# Patient Record
Sex: Female | Born: 1995 | Race: Black or African American | Hispanic: No | Smoking: Never smoker
Health system: Southern US, Community
[De-identification: ages and names within clinical notes are randomized; demographics above are authoritative.]

## PROBLEM LIST (undated history)

## (undated) DIAGNOSIS — T7840XA Allergy, unspecified, initial encounter: Secondary | ICD-10-CM

## (undated) DIAGNOSIS — E669 Obesity, unspecified: Secondary | ICD-10-CM

## (undated) DIAGNOSIS — K219 Gastro-esophageal reflux disease without esophagitis: Secondary | ICD-10-CM

## (undated) DIAGNOSIS — R519 Headache, unspecified: Secondary | ICD-10-CM

## (undated) DIAGNOSIS — F419 Anxiety disorder, unspecified: Secondary | ICD-10-CM

## (undated) DIAGNOSIS — N390 Urinary tract infection, site not specified: Secondary | ICD-10-CM

## (undated) DIAGNOSIS — N809 Endometriosis, unspecified: Secondary | ICD-10-CM

## (undated) DIAGNOSIS — R51 Headache: Secondary | ICD-10-CM

## (undated) HISTORY — PX: NO PAST SURGERIES: SHX2092

## (undated) HISTORY — DX: Obesity, unspecified: E66.9

## (undated) HISTORY — DX: Allergy, unspecified, initial encounter: T78.40XA

---
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
## 2015-02-12 IMAGING — DX DG CHEST 2V
2 series · 2 of 2 positions shown · non-contrast
Comparison: None

CLINICAL DATA: Cough and chest pain for 1 day, asthma

EXAM:
CHEST  2 VIEW

[chest pa]
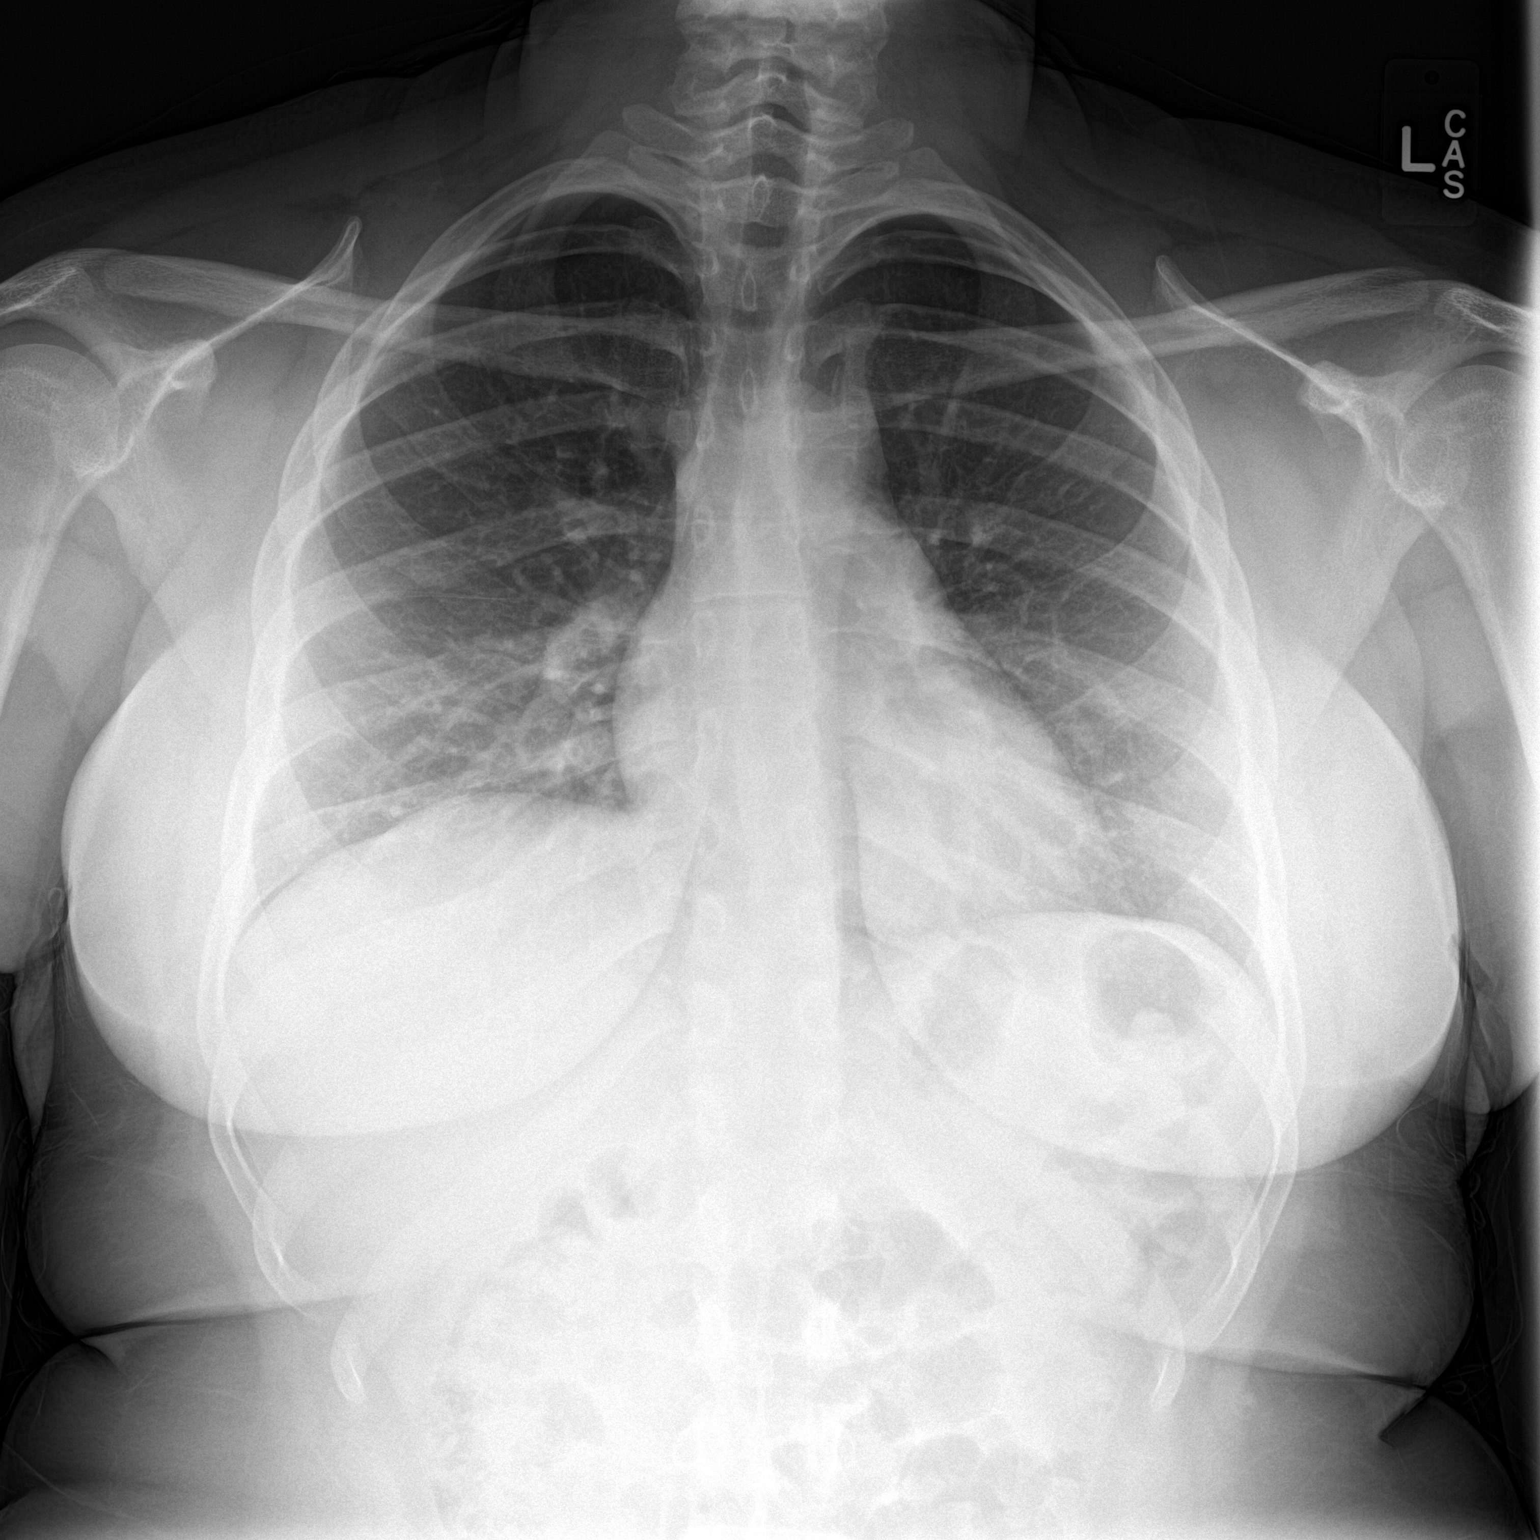

[chest lat]
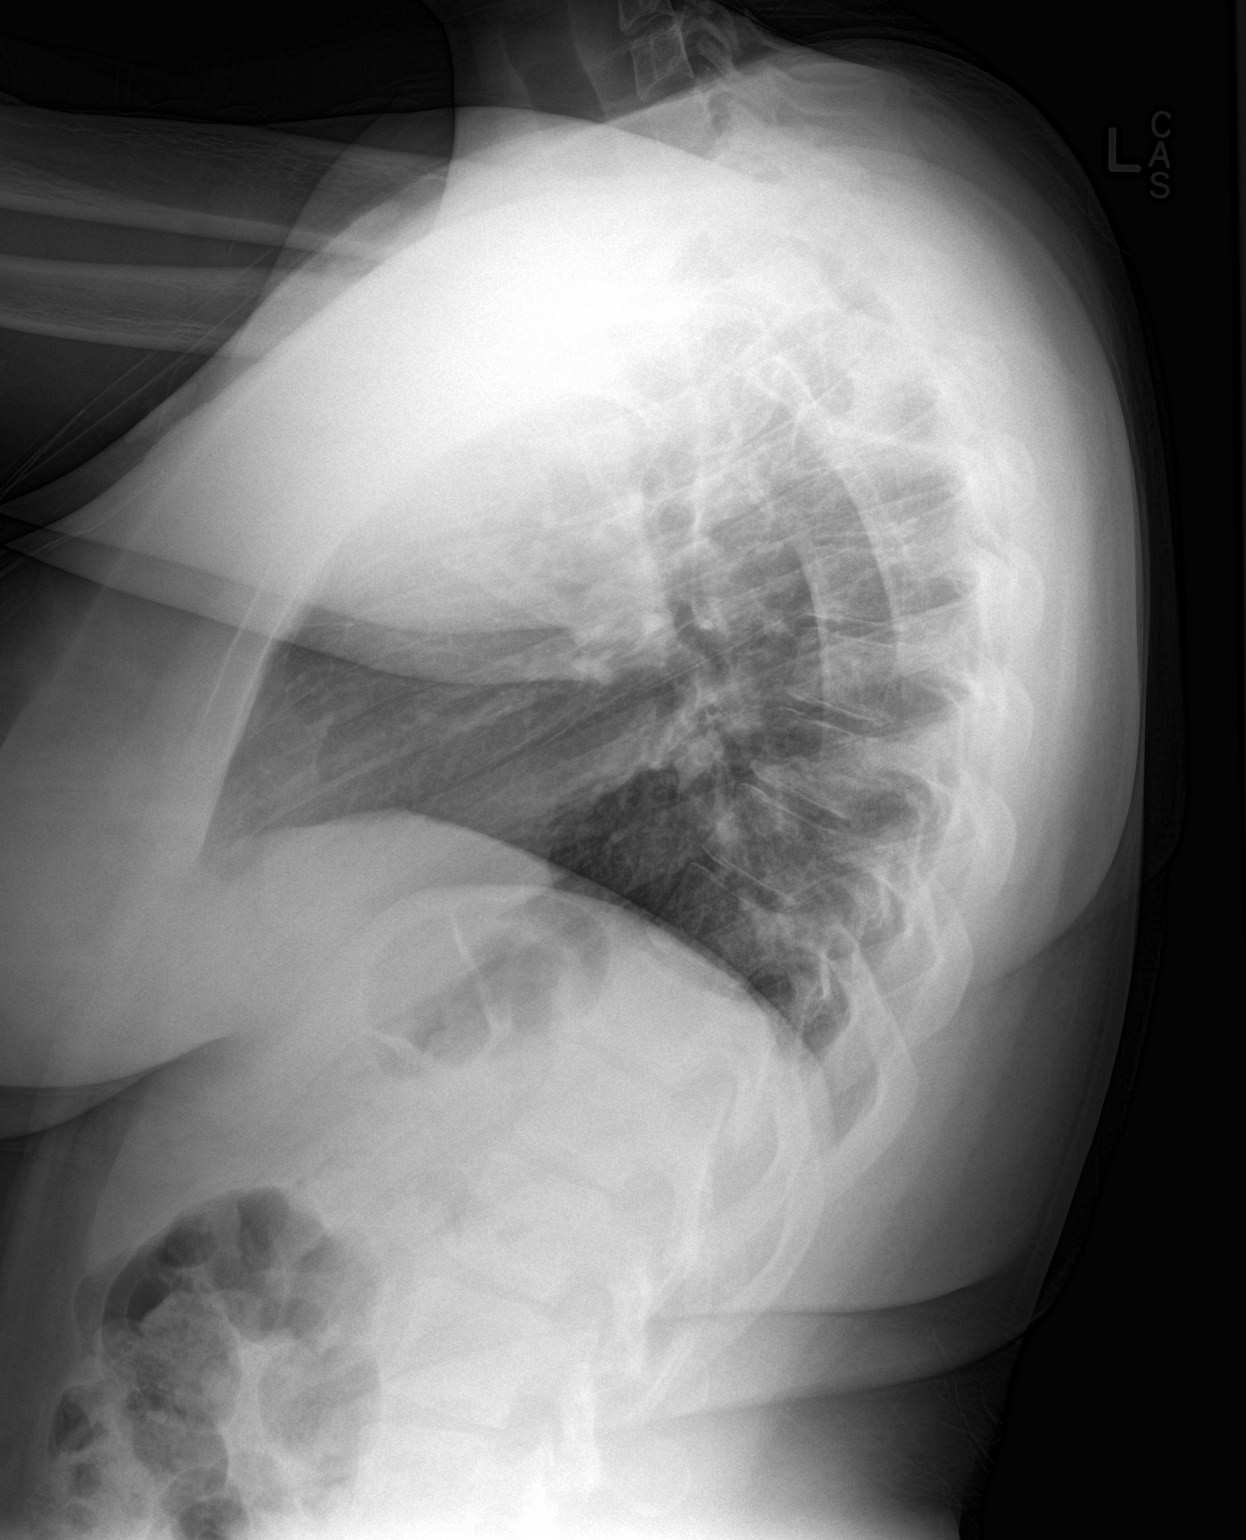

[2 of 2 positions shown; findings below may reference images not displayed]

FINDINGS: The heart size and mediastinal contours are within normal limits.
Both lungs are clear. The visualized skeletal structures are
unremarkable.
IMPRESSION: No active cardiopulmonary disease.

## 2015-09-03 ENCOUNTER — Emergency Department
Admission: EM | Admit: 2015-09-03 | Discharge: 2015-09-03 | Disposition: A | Discharge status: Home / Self Care | Payer: Medicaid Other | Attending: Emergency Medicine | Admitting: Emergency Medicine

## 2015-09-03 ENCOUNTER — Encounter: Payer: Self-pay | Admitting: Emergency Medicine

## 2015-09-03 ENCOUNTER — Emergency Department: Payer: Medicaid Other

## 2015-09-03 DIAGNOSIS — Z91040 Latex allergy status: Secondary | ICD-10-CM | POA: Diagnosis not present

## 2015-09-03 DIAGNOSIS — Z79899 Other long term (current) drug therapy: Secondary | ICD-10-CM | POA: Insufficient documentation

## 2015-09-03 DIAGNOSIS — R1030 Lower abdominal pain, unspecified: Secondary | ICD-10-CM | POA: Diagnosis not present

## 2015-09-03 DIAGNOSIS — J45909 Unspecified asthma, uncomplicated: Secondary | ICD-10-CM | POA: Diagnosis not present

## 2015-09-03 DIAGNOSIS — Z91018 Allergy to other foods: Secondary | ICD-10-CM | POA: Insufficient documentation

## 2015-09-03 DIAGNOSIS — Z791 Long term (current) use of non-steroidal anti-inflammatories (NSAID): Secondary | ICD-10-CM | POA: Insufficient documentation

## 2015-09-03 LAB — CBC WITH DIFFERENTIAL/PLATELET
BASOS PCT: 1 %
Basophils Absolute: 0 10*3/uL (ref 0–0.1)
EOS ABS: 0.1 10*3/uL (ref 0–0.7)
EOS PCT: 2 %
HCT: 35.3 % (ref 35.0–47.0)
Hemoglobin: 11.7 g/dL — ABNORMAL LOW (ref 12.0–16.0)
LYMPHS ABS: 1.3 10*3/uL (ref 1.0–3.6)
Lymphocytes Relative: 33 %
MCH: 29 pg (ref 26.0–34.0)
MCHC: 33.3 g/dL (ref 32.0–36.0)
MCV: 87.2 fL (ref 80.0–100.0)
MONOS PCT: 11 %
Monocytes Absolute: 0.4 10*3/uL (ref 0.2–0.9)
NEUTROS PCT: 53 %
Neutro Abs: 2.1 10*3/uL (ref 1.4–6.5)
Platelets: 437 10*3/uL (ref 150–440)
RBC: 4.04 MIL/uL (ref 3.80–5.20)
RDW: 13 % (ref 11.5–14.5)
WBC: 3.9 10*3/uL (ref 3.6–11.0)

## 2015-09-03 LAB — URINALYSIS COMPLETE WITH MICROSCOPIC (ARMC ONLY)
BILIRUBIN URINE: NEGATIVE
Bacteria, UA: NONE SEEN
Glucose, UA: NEGATIVE mg/dL
HGB URINE DIPSTICK: NEGATIVE
Ketones, ur: NEGATIVE mg/dL
Nitrite: NEGATIVE
PH: 7 (ref 5.0–8.0)
PROTEIN: NEGATIVE mg/dL
Specific Gravity, Urine: 1.019 (ref 1.005–1.030)

## 2015-09-03 LAB — COMPREHENSIVE METABOLIC PANEL
ALK PHOS: 67 U/L (ref 38–126)
ALT: 13 U/L — ABNORMAL LOW (ref 14–54)
AST: 14 U/L — ABNORMAL LOW (ref 15–41)
Albumin: 4 g/dL (ref 3.5–5.0)
Anion gap: 6 (ref 5–15)
BUN: 9 mg/dL (ref 6–20)
CALCIUM: 8.7 mg/dL — AB (ref 8.9–10.3)
CHLORIDE: 105 mmol/L (ref 101–111)
CO2: 26 mmol/L (ref 22–32)
Creatinine, Ser: 0.73 mg/dL (ref 0.44–1.00)
GFR calc non Af Amer: >60 mL/min (ref >60)
Glucose, Bld: 94 mg/dL (ref 65–99)
POTASSIUM: 3.9 mmol/L (ref 3.5–5.1)
SODIUM: 137 mmol/L (ref 135–145)
Total Bilirubin: 0.3 mg/dL (ref 0.3–1.2)
Total Protein: 6.9 g/dL (ref 6.5–8.1)

## 2015-09-03 LAB — TSH: TSH: 1.46 u[IU]/mL (ref 0.350–4.500)

## 2015-09-03 LAB — POCT PREGNANCY, URINE: Preg Test, Ur: NEGATIVE

## 2015-09-03 LAB — LIPASE, BLOOD: Lipase: 38 U/L (ref 11–51)

## 2015-09-03 NOTE — ED Provider Notes (Signed)
Time Seen: Approximately [id number]  I have reviewed the triage notes  Chief Complaint: Possible Pregnancy   History of Present Illness: Dawn Walton is a 20 y.o. female *who states that she's had a several week history of intermittent bilateral lower abdominal pain with occasional pain toward the left flank area. The pain seems to be exacerbated by meals and sometimes when she lies on her left side. Patient states she's had a history recently of irregular menstrual periods which started back in February. She denies any current vaginal discharge or bleeding. She states she does have some concern that she may be pregnant. She did not do a home pregnancy test yet. She denies any chest or pleuritic component to the pain. She denies any dysuria, hematuria, urinary frequency. She denies any loose stool or constipation.   Past Medical History  Diagnosis Date  . Allergy   . Asthma     unclear per pt  . Obesity     Patient Active Problem List   Diagnosis Date Noted  . URI (upper respiratory infection) 05/02/2014  . Candida vaginitis 05/02/2014  . Suprapubic pain 03/21/2014  . Leukopenia 07/03/2013  . Obesity 05/04/2013  . Contraception 05/04/2013  . Menorrhagia 04/14/2013    History reviewed. No pertinent past surgical history.  History reviewed. No pertinent past surgical history.  Current Outpatient Rx  Name  Route  Sig  Dispense  Refill  . albuterol (PROVENTIL HFA;VENTOLIN HFA) 108 (90 BASE) MCG/ACT inhaler   Inhalation   Inhale into the lungs every 6 (six) hours as needed for wheezing or shortness of breath.         Marland Kitchen ibuprofen (ADVIL,MOTRIN) 800 MG tablet   Oral   Take 1 tablet (800 mg total) by mouth 3 (three) times daily.   21 tablet   0   . nitrofurantoin, macrocrystal-monohydrate, (MACROBID) 100 MG capsule   Oral   Take 1 capsule (100 mg total) by mouth 2 (two) times daily.   10 capsule   0   . ondansetron (ZOFRAN) 4 MG tablet   Oral   Take 1 tablet (4 mg  total) by mouth every 6 (six) hours.   12 tablet   0     Allergies:  Fruit & vegetable daily; Apple; Carrot; Latex; and Tomato  Family History: Family History  Problem Relation Age of Onset  . Hyperlipidemia Maternal Grandmother   . Hypertension Maternal Grandmother   . Diabetes Maternal Grandfather   . Heart disease Maternal Grandfather   . Stroke Maternal Grandfather     Social History: Social History  Substance Use Topics  . Smoking status: Never Smoker   . Smokeless tobacco: Never Used  . Alcohol Use: No     Review of Systems:   10 point review of systems was performed and was otherwise negative:  Constitutional: No fever Eyes: No visual disturbances ENT: No sore throat, ear pain Cardiac: No chest pain Respiratory: No shortness of breath, wheezing, or stridor Abdomen: Bilateral lower abdominal pain with occasional nausea and states food "" taste funny "". No loose stool or diarrhea Endocrine: No weight loss, No night sweats Extremities: Mild increase in peripheral edema bilaterally, no calf pain. Skin: No rashes, easy bruising Neurologic: No focal weakness, trouble with speech or swollowing Urologic: No dysuria, Hematuria, or urinary frequency   Physical Exam:  ED Triage Vitals  Enc Vitals Group     BP 09/03/15 1334 114/70 mmHg     Pulse Rate 09/03/15 1334 99  Resp 09/03/15 1334 18     Temp 09/03/15 1334 98.1 F (36.7 C)     Temp Source 09/03/15 1334 Oral     SpO2 09/03/15 1334 98 %     Weight --      Height --      Head Cir --      Peak Flow --      Pain Score 09/03/15 1338 4     Pain Loc --      Pain Edu? --      Excl. in [place]? --     General: Awake , Alert , and Oriented times 3; GCS 15 Head: Normal cephalic , atraumatic Eyes: Pupils equal , round, reactive to light Nose/Throat: No nasal drainage, patent upper airway without erythema or exudate.  Neck: Supple, Full range of motion, No anterior adenopathy or palpable thyroid masses Lungs:  Clear to ascultation without wheezes , rhonchi, or rales Heart: Regular rate, regular rhythm without murmurs , gallops , or rubs Abdomen: Soft, non tender without rebound, guarding , or rigidity; bowel sounds positive and symmetric in all 4 quadrants. No organomegaly .        Extremities: 2 plus symmetric pulses. No edema, clubbing or cyanosis Neurologic: normal ambulation, Motor symmetric without deficits, sensory intact Skin: warm, dry, no rashes   Labs:   All laboratory work was reviewed including any pertinent negatives or positives listed below:  Labs Reviewed  [place] ([place]) - Abnormal; Notable for the following:    Color, Urine YELLOW (*)    APPearance HAZY (*)    Leukocytes, UA 3+ (*)    Squamous Epithelial / LPF 6-30 (*)    All other components within normal limits  CBC WITH DIFFERENTIAL/PLATELET  COMPREHENSIVE METABOLIC PANEL  LIPASE, BLOOD  TSH  POC URINE PREG, ED  POCT PREGNANCY, URINE  Pregnancy test is negative  Radiology: Patient declined on CAT scan evaluation I personally reviewed the radiologic studies    ED Course:  Differential diagnosis includes but is not exclusive to ovarian cyst, ovarian torsion, acute appendicitis, urinary tract infection, endometriosis, bowel obstruction, colitis, renal colic, gastroenteritis, etc.  Patient's stay here was uneventful and I felt this was unlikely to be a surgical abdomen. The patient declined on CAT scan evaluation to rule out appendicitis or other surgical issues on her abdomen. The patient was advised that this time her laboratory work is fine and she can always return her later time especially if she develops a fever or focal lower abdominal discomfort. The patient was advised to take over-the-counter pain medication.    Assessment:  Acute unspecified lower abdominal pain in a female   Final Clinical Impression  Final diagnoses:  Lower abdominal pain     Plan:   Outpatient Patient was advised to return immediately if condition worsens. Patient was advised to follow up with their primary care physician or other specialized physicians involved in their outpatient care. The patient and/or family member/power of attorney had laboratory results reviewed at the bedside. All questions and concerns were addressed and appropriate discharge instructions were distributed by the nursing staff.            Daymon Larsen, MD 09/03/15 [phone]

## 2015-09-03 NOTE — Discharge Instructions (Signed)
Abdominal Pain, Adult °Many things can cause abdominal pain. Usually, abdominal pain is not caused by a disease and will improve without treatment. It can often be observed and treated at home. Your health care provider will do a physical exam and possibly order blood tests and X-rays to help determine the seriousness of your pain. However, in many cases, more time must pass before a clear cause of the pain can be found. Before that point, your health care provider may not know if you need more testing or further treatment. °HOME CARE INSTRUCTIONS °Monitor your abdominal pain for any changes. The following actions may help to alleviate any discomfort you are experiencing: °· Only take over-the-counter or prescription medicines as directed by your health care provider. °· Do not take laxatives unless directed to do so by your health care provider. °· Try a clear liquid diet (broth, tea, or water) as directed by your health care provider. Slowly move to a bland diet as tolerated. °SEEK MEDICAL CARE IF: °· You have unexplained abdominal pain. °· You have abdominal pain associated with nausea or diarrhea. °· You have pain when you urinate or have a bowel movement. °· You experience abdominal pain that wakes you in the night. °· You have abdominal pain that is worsened or improved by eating food. °· You have abdominal pain that is worsened with eating fatty foods. °· You have a fever. °SEEK IMMEDIATE MEDICAL CARE IF: °· Your pain does not go away within 2 hours. °· You keep throwing up (vomiting). °· Your pain is felt only in portions of the abdomen, such as the right side or the left lower portion of the abdomen. °· You pass bloody or black tarry stools. °MAKE SURE YOU: °· Understand these instructions. °· Will watch your condition. °· Will get help right away if you are not doing well or get worse. °  °This information is not intended to replace advice given to you by your health care provider. Make sure you discuss  any questions you have with your health care provider. °  °Document Released: 12/31/2004 Document Revised: 12/12/2014 Document Reviewed: 11/30/2012 °Elsevier Interactive Patient Education ©2016 [employer] ° ° °Please return immediately if condition worsens. Please contact her primary physician or the physician you were given for referral. If you have any specialist physicians involved in her treatment and plan please also contact them. Thank you for using [facility]. ° °

## 2015-09-03 NOTE — ED Notes (Signed)
Called lab to check on results for cmp and lipase; TSH is holdup but they will result other items shortly.

## 2015-09-03 NOTE — ED Notes (Signed)
Pt presents with abd pain and states she just wants to find out if she is pregnant or not. Denies any vaginal bleeding. Pt with hx of miscarriage last year.

## 2016-04-26 ENCOUNTER — Emergency Department (HOSPITAL_COMMUNITY)
Admission: EM | Admit: 2016-04-26 | Discharge: 2016-04-26 | Disposition: A | Discharge status: Home / Self Care | Payer: Medicaid Other | Attending: Emergency Medicine | Admitting: Emergency Medicine

## 2016-04-26 ENCOUNTER — Encounter (HOSPITAL_COMMUNITY): Payer: Self-pay

## 2016-04-26 ENCOUNTER — Emergency Department (HOSPITAL_COMMUNITY): Payer: Medicaid Other

## 2016-04-26 DIAGNOSIS — N898 Other specified noninflammatory disorders of vagina: Secondary | ICD-10-CM | POA: Diagnosis not present

## 2016-04-26 DIAGNOSIS — Z91040 Latex allergy status: Secondary | ICD-10-CM | POA: Diagnosis not present

## 2016-04-26 DIAGNOSIS — K529 Noninfective gastroenteritis and colitis, unspecified: Secondary | ICD-10-CM | POA: Insufficient documentation

## 2016-04-26 DIAGNOSIS — R1032 Left lower quadrant pain: Secondary | ICD-10-CM | POA: Diagnosis present

## 2016-04-26 LAB — URINALYSIS, ROUTINE W REFLEX MICROSCOPIC
BACTERIA UA: NONE SEEN
BILIRUBIN URINE: NEGATIVE
Glucose, UA: NEGATIVE mg/dL
HGB URINE DIPSTICK: NEGATIVE
KETONES UR: NEGATIVE mg/dL
NITRITE: NEGATIVE
PROTEIN: NEGATIVE mg/dL
Specific Gravity, Urine: 1.023 (ref 1.005–1.030)
pH: 5 (ref 5.0–8.0)

## 2016-04-26 LAB — CBC
HEMATOCRIT: 38 % (ref 36.0–46.0)
HEMOGLOBIN: 12.4 g/dL (ref 12.0–15.0)
MCH: 28.1 pg (ref 26.0–34.0)
MCHC: 32.6 g/dL (ref 30.0–36.0)
MCV: 86 fL (ref 78.0–100.0)
Platelets: 556 10*3/uL — ABNORMAL HIGH (ref 150–400)
RBC: 4.42 MIL/uL (ref 3.87–5.11)
RDW: 13.3 % (ref 11.5–15.5)
WBC: 3.6 10*3/uL — ABNORMAL LOW (ref 4.0–10.5)

## 2016-04-26 LAB — COMPREHENSIVE METABOLIC PANEL
ALT: 15 U/L (ref 14–54)
ANION GAP: 9 (ref 5–15)
AST: 18 U/L (ref 15–41)
Albumin: 3.8 g/dL (ref 3.5–5.0)
Alkaline Phosphatase: 77 U/L (ref 38–126)
BILIRUBIN TOTAL: 0.2 mg/dL — AB (ref 0.3–1.2)
BUN: 10 mg/dL (ref 6–20)
CALCIUM: 8.4 mg/dL — AB (ref 8.9–10.3)
CO2: 22 mmol/L (ref 22–32)
Chloride: 105 mmol/L (ref 101–111)
Creatinine, Ser: 0.69 mg/dL (ref 0.44–1.00)
GFR calc non Af Amer: >60 mL/min (ref >60)
Glucose, Bld: 100 mg/dL — ABNORMAL HIGH (ref 65–99)
POTASSIUM: 3.3 mmol/L — AB (ref 3.5–5.1)
Sodium: 136 mmol/L (ref 135–145)
TOTAL PROTEIN: 6.8 g/dL (ref 6.5–8.1)

## 2016-04-26 LAB — WET PREP, GENITAL
CLUE CELLS WET PREP: NONE SEEN
Sperm: NONE SEEN
Trich, Wet Prep: NONE SEEN
Yeast Wet Prep HPF POC: NONE SEEN

## 2016-04-26 LAB — POC URINE PREG, ED: Preg Test, Ur: NEGATIVE

## 2016-04-26 LAB — LIPASE, BLOOD: Lipase: 28 U/L (ref 11–51)

## 2016-04-26 MED ORDER — CIPROFLOXACIN HCL 500 MG PO TABS: 500.0000 mg | ORAL_TABLET | Freq: Two times a day (BID) | ORAL | 0 refills | Status: DC

## 2016-04-26 MED ORDER — METRONIDAZOLE 500 MG PO TABS: 500.0000 mg | ORAL_TABLET | Freq: Two times a day (BID) | ORAL | 0 refills | Status: DC

## 2016-04-26 MED ORDER — KETOROLAC TROMETHAMINE 60 MG/2ML IM SOLN
60.0000 mg | Freq: Once | INTRAMUSCULAR | Status: AC
  Administered 2016-04-26: 60 mg via INTRAMUSCULAR
  Filled 2016-04-26: qty 2

## 2016-04-26 NOTE — ED Notes (Signed)
Pt denies burning with urination, increased vaginal discharge, foul odor, frequent urination.

## 2016-04-26 NOTE — ED Notes (Signed)
Pt in ultrasound

## 2016-04-26 NOTE — ED Provider Notes (Signed)
[place] DEPT Provider Note   CSN: [id number] Arrival date & time: 04/26/16  0753     History   Chief Complaint Chief Complaint  Patient presents with  . Abdominal Pain    HPI Danyale Mayall is a 21 y.o. female.  Patient is a 21 year old female for the last 1 week has had intermittent left lower quadrant pain. She states the pain has been present only in the evenings but was there all day this morning as well. It is a dull achy pain in the left lower abdomen. It occasionally radiates into the lumbar spinal region but didn't does not radiate to the flank. Some nausea associated with the pain but no vomiting or diarrhea. No fevers, urinary or vaginal complaints. Patient's last menstrual period was in December. However she did have 2 menses in December. The first normal one was at the beginning of December and then she had 2 days of bleeding at the end of December. She does not use contraception   The history is provided by the patient.  Abdominal Pain   This is a new problem. Episode onset: 1 week ago. The problem occurs daily. The problem has been gradually worsening. The pain is associated with an unknown factor. The pain is located in the LLQ. The quality of the pain is aching and dull. The pain is at a severity of 6/10. The pain is moderate. Associated symptoms include anorexia and nausea. Pertinent negatives include fever, diarrhea, vomiting, dysuria, frequency and headaches. The symptoms are aggravated by certain positions and activity. Nothing relieves the symptoms. Past medical history comments: Menorrhagia, suprapubic pain.    Past Medical History:  Diagnosis Date  . Allergy   . Asthma    unclear per pt  . Obesity     Patient Active Problem List   Diagnosis Date Noted  . URI (upper respiratory infection) 05/02/2014  . Candida vaginitis 05/02/2014  . Suprapubic pain 03/21/2014  . Leukopenia 07/03/2013  . Obesity 05/04/2013  . Contraception 05/04/2013  .  Menorrhagia 04/14/2013    History reviewed. No pertinent surgical history.  OB History    Gravida Para Term Preterm AB Living   1             SAB TAB Ectopic Multiple Live Births                   Home Medications    Prior to Admission medications   Medication Sig Start Date End Date Taking? Authorizing Provider  albuterol (PROVENTIL HFA;VENTOLIN HFA) 108 (90 BASE) MCG/ACT inhaler Inhale into the lungs every 6 (six) hours as needed for wheezing or shortness of breath.    Historical Provider, MD  ibuprofen (ADVIL,MOTRIN) 800 MG tablet Take 1 tablet (800 mg total) by mouth 3 (three) times daily. 12/24/14   Heather Laisure, PA-C  nitrofurantoin, macrocrystal-monohydrate, (MACROBID) 100 MG capsule Take 1 capsule (100 mg total) by mouth 2 (two) times daily. 12/24/14   Heather Laisure, PA-C  ondansetron (ZOFRAN) 4 MG tablet Take 1 tablet (4 mg total) by mouth every 6 (six) hours. 12/24/14   Hyman Bible, PA-C    Family History Family History  Problem Relation Age of Onset  . Hyperlipidemia Maternal Grandmother   . Hypertension Maternal Grandmother   . Diabetes Maternal Grandfather   . Heart disease Maternal Grandfather   . Stroke Maternal Grandfather     Social History Social History  Substance Use Topics  . Smoking status: Never Smoker  . Smokeless tobacco: Never Used  .  Alcohol use No     Allergies   Fruit & vegetable daily [nutritional supplements]; Apple; Carrot [daucus carota]; Latex; and Tomato   Review of Systems Review of Systems  Constitutional: Negative for fever.  Gastrointestinal: Positive for abdominal pain, anorexia and nausea. Negative for diarrhea and vomiting.  Genitourinary: Negative for dysuria and frequency.  Neurological: Negative for headaches.  All other systems reviewed and are negative.    Physical Exam Updated Vital Signs BP 102/66 (BP Location: Left Arm)   Pulse 94   Temp 99.1 F (37.3 C) (Oral)   Resp 18   Ht 5\' 2"  (1.575 m)   Wt  257 lb 6 oz (116.7 kg)   LMP 04/05/2016   SpO2 97%   BMI 47.07 kg/m   Physical Exam  Constitutional: She is oriented to person, place, and time. She appears well-developed and well-nourished. No distress.  HENT:  Head: Normocephalic and atraumatic.  Mouth/Throat: Oropharynx is clear and moist.  Eyes: Conjunctivae and EOM are normal. Pupils are equal, round, and reactive to light.  Neck: Normal range of motion. Neck supple.  Cardiovascular: Normal rate, regular rhythm and intact distal pulses.   No murmur heard. Pulmonary/Chest: Effort normal and breath sounds normal. No respiratory distress. She has no wheezes. She has no rales.  Abdominal: Soft. Bowel sounds are normal. She exhibits no distension. There is no splenomegaly or hepatomegaly. There is tenderness in the right upper quadrant and left lower quadrant. There is no rebound and no guarding. No hernia.    Obese.  Mild tenderness in the right upper quadrant which patient was unaware of. Moderate tenderness in the left lower quadrant  Genitourinary: Uterus normal. Cervix exhibits no motion tenderness, no discharge and no friability. Right adnexum displays no tenderness and no fullness. Left adnexum displays tenderness. No tenderness in the vagina. Vaginal discharge found.  Genitourinary Comments: Mild thin normal discharge  Musculoskeletal: Normal range of motion. She exhibits no edema or tenderness.  Neurological: She is alert and oriented to person, place, and time.  Skin: Skin is warm and dry. No rash noted. No erythema.  Psychiatric: She has a normal mood and affect. Her behavior is normal.  Nursing note and vitals reviewed.    ED Treatments / Results  Labs (all labs ordered are listed, but only abnormal results are displayed) Labs Reviewed  COMPREHENSIVE METABOLIC PANEL - Abnormal; Notable for the following:       Result Value   Potassium 3.3 (*)    Glucose, Bld 100 (*)    Calcium 8.4 (*)    Total Bilirubin 0.2 (*)     All other components within normal limits  CBC - Abnormal; Notable for the following:    WBC 3.6 (*)    Platelets 556 (*)    All other components within normal limits  URINALYSIS, ROUTINE W REFLEX MICROSCOPIC - Abnormal; Notable for the following:    Leukocytes, UA TRACE (*)    Squamous Epithelial / LPF 0-5 (*)    All other components within normal limits  WET PREP, GENITAL  LIPASE, BLOOD  POC URINE PREG, ED  GC/CHLAMYDIA PROBE AMP ([place]) NOT AT [facility]    EKG  EKG Interpretation None       Radiology US Transvaginal Non-ob  Result Date: 04/26/2016 CLINICAL DATA:  Left lower quadrant pain for 1 week. EXAM: TRANSABDOMINAL AND TRANSVAGINAL ULTRASOUND OF PELVIS DOPPLER ULTRASOUND OF OVARIES TECHNIQUE: Both transabdominal and transvaginal ultrasound examinations of the pelvis were performed. Transabdominal technique was performed  for global imaging of the pelvis including uterus, ovaries, adnexal regions, and pelvic cul-de-sac. It was necessary to proceed with endovaginal exam following the transabdominal exam to visualize the endometrium and ovaries. Color and duplex Doppler ultrasound was utilized to evaluate blood flow to the ovaries. COMPARISON:  None. FINDINGS: Uterus Measurements: 7.3 x 3.1 x 4.1 cm. No fibroids or other mass visualized. Endometrium Thickness: 13 mm.  No focal abnormality visualized. Right ovary Measurements: 3.1 x 2.2 x 1.9 cm. Normal appearance/no adnexal mass. Left ovary Measurements: 3.6 x 3.0 x 2.9 cm. Normal appearance/no adnexal mass. Pulsed Doppler evaluation of both ovaries demonstrates normal low-resistance arterial and venous waveforms. Other findings No abnormal free fluid. IMPRESSION: Normal appearance of uterus and ovaries. No pelvic mass or other significant abnormality identified. No sonographic evidence for ovarian torsion. Electronically Signed   By: Earle Gell M.D.   On: 04/26/2016 10:54   US Pelvis Complete  Result Date: 04/26/2016 CLINICAL  DATA:  Left lower quadrant pain for 1 week. EXAM: TRANSABDOMINAL AND TRANSVAGINAL ULTRASOUND OF PELVIS DOPPLER ULTRASOUND OF OVARIES TECHNIQUE: Both transabdominal and transvaginal ultrasound examinations of the pelvis were performed. Transabdominal technique was performed for global imaging of the pelvis including uterus, ovaries, adnexal regions, and pelvic cul-de-sac. It was necessary to proceed with endovaginal exam following the transabdominal exam to visualize the endometrium and ovaries. Color and duplex Doppler ultrasound was utilized to evaluate blood flow to the ovaries. COMPARISON:  None. FINDINGS: Uterus Measurements: 7.3 x 3.1 x 4.1 cm. No fibroids or other mass visualized. Endometrium Thickness: 13 mm.  No focal abnormality visualized. Right ovary Measurements: 3.1 x 2.2 x 1.9 cm. Normal appearance/no adnexal mass. Left ovary Measurements: 3.6 x 3.0 x 2.9 cm. Normal appearance/no adnexal mass. Pulsed Doppler evaluation of both ovaries demonstrates normal low-resistance arterial and venous waveforms. Other findings No abnormal free fluid. IMPRESSION: Normal appearance of uterus and ovaries. No pelvic mass or other significant abnormality identified. No sonographic evidence for ovarian torsion. Electronically Signed   By: Earle Gell M.D.   On: 04/26/2016 10:54   [place] Art/ven Flow Abd Pelv Doppler  Result Date: 04/26/2016 CLINICAL DATA:  Left lower quadrant pain for 1 week. EXAM: TRANSABDOMINAL AND TRANSVAGINAL ULTRASOUND OF PELVIS DOPPLER ULTRASOUND OF OVARIES TECHNIQUE: Both transabdominal and transvaginal ultrasound examinations of the pelvis were performed. Transabdominal technique was performed for global imaging of the pelvis including uterus, ovaries, adnexal regions, and pelvic cul-de-sac. It was necessary to proceed with endovaginal exam following the transabdominal exam to visualize the endometrium and ovaries. Color and duplex Doppler ultrasound was utilized to evaluate blood flow to the  ovaries. COMPARISON:  None. FINDINGS: Uterus Measurements: 7.3 x 3.1 x 4.1 cm. No fibroids or other mass visualized. Endometrium Thickness: 13 mm.  No focal abnormality visualized. Right ovary Measurements: 3.1 x 2.2 x 1.9 cm. Normal appearance/no adnexal mass. Left ovary Measurements: 3.6 x 3.0 x 2.9 cm. Normal appearance/no adnexal mass. Pulsed Doppler evaluation of both ovaries demonstrates normal low-resistance arterial and venous waveforms. Other findings No abnormal free fluid. IMPRESSION: Normal appearance of uterus and ovaries. No pelvic mass or other significant abnormality identified. No sonographic evidence for ovarian torsion. Electronically Signed   By: Earle Gell M.D.   On: 04/26/2016 10:54    Procedures Procedures (including critical care time)  Medications Ordered in ED Medications - No data to display   Initial Impression / Assessment and Plan / ED Course  I have reviewed the triage vital signs and the nursing notes.  Pertinent labs & imaging results that were available during my care of the patient were reviewed by me and considered in my medical decision making (see chart for details).    Patient is a 21 year old female presenting today with left lower quadrant/pelvic tenderness that's been intermittent for the last week. However now is becoming more constant. She also had irregular menses in the last month. She's had some nausea but no other complaints. She is sexually active with only 1 individual and does not use protection. She denies any vaginal complaints or urinary complaints. On exam she has some mild left lower quadrant discomfort and on pelvic exam significant left adnexal discomfort. Concern for ovarian cyst. Low suspicion for PID or STI as patient does not have a suspicious history. Low suspicion for torsion and urine pregnancy test is negative so low suspicion for ectopic pregnancy. Will do an ultrasound to further evaluate. Labs with normal CMP, lipase and known  leukopenia on CBC. UA without evidence of infection. Ultrasound without evidence of torsion or cyst. Normal pelvic ultrasound. Patient could be dealing with colitis as she does have a temperature of 99 and left lower quadrant tenderness versus possible mild diverticulitis. Will treat with Cipro and Flagyl. Discussed with the patient's risk and benefits of CT scan and at this time chose to do watchful waiting. She will return if she develops high fever, vomiting or worsening pain.   Final Clinical Impressions(s) / ED Diagnoses   Final diagnoses:  Colitis    New Prescriptions New Prescriptions   CIPROFLOXACIN (CIPRO) 500 MG TABLET    Take 1 tablet (500 mg total) by mouth 2 (two) times daily.   METRONIDAZOLE (FLAGYL) 500 MG TABLET    Take 1 tablet (500 mg total) by mouth 2 (two) times daily.     Blanchie Dessert, MD 04/26/16 1116

## 2016-04-26 NOTE — ED Triage Notes (Signed)
Pt. Reports having lower abdominal pain for over 1 week.  The pain ususally comes at night  But this morning she continues to have the pain.  She denies any urinary symptoms or vaginal bleeding.  Pain is described as dull ache.  She does have intermittent nausea but denies any V/d  Skin is warm and dry.

## 2016-04-26 NOTE — ED Notes (Signed)
Pt comfortable with discharge and follow up instructions. Pt declines wheelchair, escorted to waiting area by this RN. Rx x2

## 2016-04-26 NOTE — Discharge Instructions (Signed)
Today been diagnosed with colitis which is concern for inflammation of your bowel. Your started on antibiotics to treat this infection. However if you develop worsening pain, vomiting or fever greater than 100.4 you need to return to the emergency room or your regular doctor for further evaluation. Do not drink any alcohol while taking these antibiotics as they can make you very ill

## 2016-04-27 LAB — GC/CHLAMYDIA PROBE AMP (~~LOC~~) NOT AT ARMC
CHLAMYDIA, DNA PROBE: NEGATIVE
NEISSERIA GONORRHEA: NEGATIVE

## 2016-06-15 ENCOUNTER — Encounter (HOSPITAL_COMMUNITY): Payer: Self-pay

## 2016-06-15 ENCOUNTER — Inpatient Hospital Stay (HOSPITAL_COMMUNITY)
Admission: AD | Admit: 2016-06-15 | Discharge: 2016-06-15 | Disposition: A | Discharge status: Home / Self Care | Payer: BLUE CROSS/BLUE SHIELD | Source: Ambulatory Visit | Attending: Obstetrics and Gynecology | Admitting: Obstetrics and Gynecology

## 2016-06-15 DIAGNOSIS — Z79899 Other long term (current) drug therapy: Secondary | ICD-10-CM | POA: Insufficient documentation

## 2016-06-15 DIAGNOSIS — N912 Amenorrhea, unspecified: Secondary | ICD-10-CM | POA: Insufficient documentation

## 2016-06-15 DIAGNOSIS — N898 Other specified noninflammatory disorders of vagina: Secondary | ICD-10-CM

## 2016-06-15 DIAGNOSIS — Z3202 Encounter for pregnancy test, result negative: Secondary | ICD-10-CM

## 2016-06-15 HISTORY — DX: Urinary tract infection, site not specified: N39.0

## 2016-06-15 LAB — URINALYSIS, ROUTINE W REFLEX MICROSCOPIC
BACTERIA UA: NONE SEEN
BILIRUBIN URINE: NEGATIVE
Glucose, UA: NEGATIVE mg/dL
HGB URINE DIPSTICK: NEGATIVE
Ketones, ur: NEGATIVE mg/dL
NITRITE: NEGATIVE
Protein, ur: NEGATIVE mg/dL
Specific Gravity, Urine: 1.019 (ref 1.005–1.030)
pH: 7 (ref 5.0–8.0)

## 2016-06-15 LAB — WET PREP, GENITAL
Clue Cells Wet Prep HPF POC: NONE SEEN
Sperm: NONE SEEN
Trich, Wet Prep: NONE SEEN
Yeast Wet Prep HPF POC: NONE SEEN

## 2016-06-15 LAB — POCT PREGNANCY, URINE: PREG TEST UR: NEGATIVE

## 2016-06-15 NOTE — MAU Note (Signed)
Pt presents for confirmation of pregnancy. LMP 05/10/16. Took a pregnancy test at home that was negative. Reports slight cramping. Denies bleeding. Reports a thick white discharge.

## 2016-06-15 NOTE — Discharge Instructions (Signed)
Secondary Amenorrhea Secondary amenorrhea is the stopping of menstrual flow for 3-6 months in a female who has previously had periods. There are many possible causes. Most of these causes are not serious. Usually, treating the underlying problem causing the loss of menses will return your periods to normal. What are the causes? Some common and uncommon causes of not menstruating include:  Malnutrition.  Low blood sugar (hypoglycemia).  Polycystic ovary disease.  Stress or fear.  Breastfeeding.  Hormone imbalance.  Ovarian failure.  Medicines.  Extreme obesity.  Cystic fibrosis.  Low body weight or drastic weight reduction from any cause.  Early menopause.  Removal of ovaries or uterus.  Contraceptives.  Illness.  Long-term (chronic) illnesses.  Cushing syndrome.  Thyroid problems.  Birth control pills, patches, or vaginal rings for birth control. What increases the risk? You may be at greater risk of secondary amenorrhea if:  You have a family history of this condition.  You have an eating disorder.  You do athletic training. How is this diagnosed? A diagnosis is made by your health care provider taking a medical history and doing a physical exam. This will include a pelvic exam to check for problems with your reproductive organs. Pregnancy must be ruled out. Often, numerous blood tests are done to measure different hormones in the body. Urine testing may be done. Specialized exams (ultrasound, CT scan, MRI, or hysteroscopy) may have to be done as well as measuring the body mass index (BMI). How is this treated? Treatment depends on the cause of the amenorrhea. If an eating disorder is present, this can be treated with an adequate diet and therapy. Chronic illnesses may improve with treatment of the illness. Amenorrhea may be corrected with medicines, lifestyle changes, or surgery. If the amenorrhea cannot be corrected, it is sometimes possible to create a false  menstruation with medicines. Follow these instructions at home:  Maintain a healthy diet.  Manage weight problems.  Exercise regularly but not excessively.  Get adequate sleep.  Manage stress.  Be aware of changes in your menstrual cycle. Keep a record of when your periods occur. Note the date your period starts, how long it lasts, and any problems. Contact a health care provider if: Your symptoms do not get better with treatment. This information is not intended to replace advice given to you by your health care provider. Make sure you discuss any questions you have with your health care provider. Document Released: 05/04/2006 Document Revised: 08/29/2015 Document Reviewed: 09/08/2012 Elsevier Interactive Patient Education  2017 [employer].

## 2016-06-15 NOTE — MAU Provider Note (Signed)
History     CSN: [id number]  Arrival date and time: 06/15/16 2109   None     Chief Complaint  Patient presents with  . Possible Pregnancy   Non-pregnant female here with amenorrhea and vaginal discharge. The discharge is thick then thin at times and white. No itching. No odor. No new partner in 5 years. LMP was 05/10/16 and she usually has regular menses. She took a HPT but did not read the results. She is not using contraception and would welcome a pregnancy. She also reports mild uterine cramping. Rates pain 2/10. Has not used anything for it.     Pertinent Gynecological History: Menses: 05/10/16 Contraception: none Sexually transmitted diseases: no past history   Past Medical History:  Diagnosis Date  . Allergy   . Asthma    unclear per pt  . Obesity   . UTI (urinary tract infection)     History reviewed. No pertinent surgical history.  Family History  Problem Relation Age of Onset  . Hyperlipidemia Maternal Grandmother   . Hypertension Maternal Grandmother   . Diabetes Maternal Grandfather   . Heart disease Maternal Grandfather   . Stroke Maternal Grandfather     Social History  Substance Use Topics  . Smoking status: Never Smoker  . Smokeless tobacco: Never Used  . Alcohol use No    Allergies:  Allergies  Allergen Reactions  . Fruit & Vegetable Daily [Nutritional Supplements] Anaphylaxis and Swelling    Celery  . Apple Other (See Comments)    Lip swelling  . Carrot [Daucus Carota] Other (See Comments)    Lip swelling  . Latex Hives  . Tomato Hives    Prescriptions Prior to Admission  Medication Sig Dispense Refill Last Dose  . albuterol (PROVENTIL HFA;VENTOLIN HFA) 108 (90 BASE) MCG/ACT inhaler Inhale into the lungs every 6 (six) hours as needed for wheezing or shortness of breath.   More than a month at Unknown time  . ciprofloxacin (CIPRO) 500 MG tablet Take 1 tablet (500 mg total) by mouth 2 (two) times daily. 14 tablet 0   . ibuprofen  (ADVIL,MOTRIN) 800 MG tablet Take 1 tablet (800 mg total) by mouth 3 (three) times daily. 21 tablet 0 More than a month at Unknown time  . metroNIDAZOLE (FLAGYL) 500 MG tablet Take 1 tablet (500 mg total) by mouth 2 (two) times daily. 14 tablet 0   . nitrofurantoin, macrocrystal-monohydrate, (MACROBID) 100 MG capsule Take 1 capsule (100 mg total) by mouth 2 (two) times daily. (Patient not taking: Reported on 04/26/2016) 10 capsule 0 Completed Course at Unknown time  . ondansetron (ZOFRAN) 4 MG tablet Take 1 tablet (4 mg total) by mouth every 6 (six) hours. (Patient not taking: Reported on 04/26/2016) 12 tablet 0 More than a month at Unknown time    Review of Systems  Gastrointestinal: Negative.   Genitourinary: Positive for pelvic pain (cramping) and vaginal discharge. Negative for vaginal bleeding.   Physical Exam   Blood pressure 132/82, pulse 80, temperature 98 F (36.7 C), temperature source Oral, resp. rate 16, height 5\' 2"  (1.575 m), weight 117.5 kg (259 lb), last menstrual period 05/10/2016, unknown if currently breastfeeding.  Physical Exam  Nursing note and vitals reviewed. Constitutional: She is oriented to person, place, and time. She appears well-developed and well-nourished. No distress (appears comfortable).  HENT:  Head: Normocephalic.  Neck: Normal range of motion.  Cardiovascular: Normal rate.   Respiratory: Effort normal.  Genitourinary:  Genitourinary Comments: External: no erythema or  discharge  Musculoskeletal: Normal range of motion.  Neurological: She is alert and oriented to person, place, and time.  Skin: Skin is warm and dry.  Psychiatric: She has a normal mood and affect.   Results for orders placed or performed during the hospital encounter of 06/15/16 (from the past 24 hour(s))  Urinalysis, Routine w reflex microscopic     Status: Abnormal   Collection Time: 06/15/16  9:30 PM  Result Value Ref Range   Color, Urine YELLOW YELLOW   APPearance HAZY (A) CLEAR    Specific Gravity, Urine 1.019 1.005 - 1.030   pH 7.0 5.0 - 8.0   Glucose, UA NEGATIVE NEGATIVE mg/dL   Hgb urine dipstick NEGATIVE NEGATIVE   Bilirubin Urine NEGATIVE NEGATIVE   Ketones, ur NEGATIVE NEGATIVE mg/dL   Protein, ur NEGATIVE NEGATIVE mg/dL   Nitrite NEGATIVE NEGATIVE   Leukocytes, UA SMALL (A) NEGATIVE   RBC / HPF 0-5 0 - 5 RBC/hpf   WBC, UA 0-5 0 - 5 WBC/hpf   Bacteria, UA NONE SEEN NONE SEEN   Squamous Epithelial / LPF 6-30 (A) NONE SEEN   Mucous PRESENT   Pregnancy, urine POC     Status: None   Collection Time: 06/15/16  9:39 PM  Result Value Ref Range   Preg Test, Ur NEGATIVE NEGATIVE  Wet prep, genital     Status: Abnormal   Collection Time: 06/15/16 10:05 PM  Result Value Ref Range   Yeast Wet Prep HPF POC NONE SEEN NONE SEEN   Trich, Wet Prep NONE SEEN NONE SEEN   Clue Cells Wet Prep HPF POC NONE SEEN NONE SEEN   WBC, Wet Prep HPF POC MODERATE (A) NONE SEEN   Sperm NONE SEEN    MAU Course  Procedures  MDM Labs ordered and reviewed. No evidence of pregnancy or infection. Cultures sent. Reassurance given. Stable for discharge home.  Assessment and Plan   1. Amenorrhea   2. Vaginal discharge   3. Negative pregnancy test    Discharge home Follow up at [place] for annual exam or prn Home UPT weekly until menses or positive result  Allergies as of 06/15/2016      Reactions   Fruit & Vegetable Daily [nutritional Supplements] Anaphylaxis, Swelling   Celery   Apple Other (See Comments)   Lip swelling   Carrot [daucus Carota] Other (See Comments)   Lip swelling   Latex Hives   Tomato Hives      Medication List    STOP taking these medications   ciprofloxacin 500 MG tablet Commonly known as:  CIPRO   ibuprofen 800 MG tablet Commonly known as:  ADVIL,MOTRIN   metroNIDAZOLE 500 MG tablet Commonly known as:  FLAGYL   nitrofurantoin (macrocrystal-monohydrate) 100 MG capsule Commonly known as:  MACROBID   ondansetron 4 MG tablet Commonly  known as:  ZOFRAN     TAKE these medications   albuterol 108 (90 Base) MCG/ACT inhaler Commonly known as:  PROVENTIL HFA;VENTOLIN HFA Inhale into the lungs every 6 (six) hours as needed for wheezing or shortness of breath.      Julianne Handler, CNM 06/15/2016, 9:57 PM

## 2016-06-16 LAB — GC/CHLAMYDIA PROBE AMP (~~LOC~~) NOT AT ARMC
Chlamydia: NEGATIVE
Neisseria Gonorrhea: NEGATIVE

## 2017-02-11 ENCOUNTER — Emergency Department
Admission: EM | Admit: 2017-02-11 | Discharge: 2017-02-11 | Disposition: A | Discharge status: Home / Self Care | Payer: Self-pay | Attending: Emergency Medicine | Admitting: Emergency Medicine

## 2017-02-11 ENCOUNTER — Other Ambulatory Visit: Payer: Self-pay

## 2017-02-11 ENCOUNTER — Encounter: Payer: Self-pay | Admitting: Emergency Medicine

## 2017-02-11 DIAGNOSIS — R519 Headache, unspecified: Secondary | ICD-10-CM

## 2017-02-11 DIAGNOSIS — R51 Headache: Secondary | ICD-10-CM | POA: Insufficient documentation

## 2017-02-11 DIAGNOSIS — Z91040 Latex allergy status: Secondary | ICD-10-CM | POA: Insufficient documentation

## 2017-02-11 DIAGNOSIS — M722 Plantar fascial fibromatosis: Secondary | ICD-10-CM | POA: Insufficient documentation

## 2017-02-11 DIAGNOSIS — J45909 Unspecified asthma, uncomplicated: Secondary | ICD-10-CM | POA: Insufficient documentation

## 2017-02-11 MED ORDER — PROMETHAZINE HCL 25 MG/ML IJ SOLN
12.5000 mg | Freq: Once | INTRAMUSCULAR | Status: DC
  Filled 2017-02-11: qty 1

## 2017-02-11 MED ORDER — MELOXICAM 15 MG PO TABS: 15.0000 mg | ORAL_TABLET | Freq: Every day | ORAL | 1 refills | Status: AC

## 2017-02-11 MED ORDER — KETOROLAC TROMETHAMINE 30 MG/ML IJ SOLN
30.0000 mg | Freq: Once | INTRAMUSCULAR | Status: AC
  Administered 2017-02-11: 30 mg via INTRAMUSCULAR
  Filled 2017-02-11: qty 1

## 2017-02-11 MED ORDER — PROMETHAZINE HCL 25 MG/ML IJ SOLN
12.5000 mg | Freq: Once | INTRAMUSCULAR | Status: AC
  Administered 2017-02-11: 12.5 mg via INTRAMUSCULAR

## 2017-02-11 NOTE — ED Provider Notes (Signed)
[facility] Emergency Department Provider Note  ____________________________________________  Time seen: Approximately 6:15 PM  I have reviewed the triage vital signs and the nursing notes.   HISTORY  Chief Complaint Headache and Foot Pain    HPI Dawn Walton is a 21 y.o. female presents to the emergency department with 7 out of 10 frontal headache with associated photophobia and nausea but no vomiting for the past 1 week.  Patient reports that headache occurs intermittently.  Patient reports that she used to have a history of migraines in adolescence but migraines have become intermittent in adulthood.  She denies prodrome of aura.  This is not the worst headache of her life.  She denies weakness, radiculopathy or changes in sensation in the upper or lower extremities.  She has been speaking in complete sentences.  Patient also reports right heel pain that is worsened with initial ambulation in the morning and with prolonged standing throughout the day.  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  Patient has been taking Aleve but is attempted no other measures.   Past Medical History:  Diagnosis Date  . Allergy   . Asthma    unclear per pt  . Obesity   . UTI (urinary tract infection)     Patient Active Problem List   Diagnosis Date Noted  . URI (upper respiratory infection) 05/02/2014  . Candida vaginitis 05/02/2014  . Suprapubic pain 03/21/2014  . Leukopenia 07/03/2013  . Obesity 05/04/2013  . Contraception 05/04/2013  . Menorrhagia 04/14/2013    History reviewed. No pertinent surgical history.  Prior to Admission medications   Medication Sig Start Date End Date Taking? Authorizing Provider  albuterol (PROVENTIL HFA;VENTOLIN HFA) 108 (90 BASE) MCG/ACT inhaler Inhale into the lungs every 6 (six) hours as needed for wheezing or shortness of breath.    [provider]  meloxicam (MOBIC) 15 MG tablet Take 1 tablet (15 mg total) daily for 7  days by mouth. 02/11/17 02/18/17  Lannie Fields, PA-C    Allergies Fruit & vegetable daily [nutritional supplements]; Apple; Carrot [daucus carota]; Latex; and Tomato  Family History  Problem Relation Age of Onset  . Hyperlipidemia Maternal Grandmother   . Hypertension Maternal Grandmother   . Diabetes Maternal Grandfather   . Heart disease Maternal Grandfather   . Stroke Maternal Grandfather     Social History Social History   Tobacco Use  . Smoking status: Never Smoker  . Smokeless tobacco: Never Used  Substance Use Topics  . Alcohol use: No    Alcohol/week: 0.0 oz  . Drug use: No     Review of Systems  Constitutional: No fever/chills Eyes: No visual changes. No discharge ENT: No upper respiratory complaints. Cardiovascular: no chest pain. Respiratory: no cough. No SOB. Gastrointestinal: No abdominal pain.  No nausea, no vomiting.  No diarrhea.  No constipation. Musculoskeletal: Patient has right heel pain. Skin: Negative for rash, abrasions, lacerations, ecchymosis. Neurological: Patient has had headache, no focal weakness or numbness.   ____________________________________________   PHYSICAL EXAM:  VITAL SIGNS: ED Triage Vitals  Enc Vitals Group     BP 02/11/17 1722 98/62     Pulse Rate 02/11/17 1722 83     Resp 02/11/17 1722 16     Temp 02/11/17 1722 99.1 F (37.3 C)     Temp Source 02/11/17 1722 Oral     SpO2 02/11/17 1722 99 %     Weight 02/11/17 1723 240 lb (108.9 kg)     Height 02/11/17  1723 5\' 2"  (1.575 m)     Head Circumference --      Peak Flow --      Pain Score 02/11/17 1725 7     Pain Loc --      Pain Edu? --      Excl. in [place]? --      Constitutional: Alert and oriented. Well appearing and in no acute distress. Eyes: Conjunctivae are normal. PERRL. EOMI. Head: Atraumatic. ENT:      Ears: TMs are pearly bilaterally.      Nose: No congestion/rhinnorhea.      Mouth/Throat: Mucous membranes are moist.  Neck: Full range of motion.   Cardiovascular: Normal rate, regular rhythm. Normal S1 and S2.  Good peripheral circulation. Respiratory: Normal respiratory effort without tachypnea or retractions. Lungs CTAB. Good air entry to the bases with no decreased or absent breath sounds. Gastrointestinal: Bowel sounds 4 quadrants. Soft and nontender to palpation. No guarding or rigidity. No palpable masses. No distention. No CVA tenderness. Musculoskeletal: Full range of motion to all extremities. No gross deformities appreciated.  Patient has reproducible pain at the insertion site of the plantar fascia, right.  Palpable dorsalis pedis pulse, right. Neurologic:  Normal speech and language. No gross focal neurologic deficits are appreciated.  Skin:  Skin is warm, dry and intact. No rash noted. Psychiatric: Patient has had headache. Mood and affect are normal. Speech and behavior are normal. Patient exhibits appropriate insight and judgement.   ____________________________________________   LABS (all labs ordered are listed, but only abnormal results are displayed)  Labs Reviewed - No data to display ____________________________________________  EKG   ____________________________________________  RADIOLOGY   No results found.  ____________________________________________    PROCEDURES  Procedure(s) performed:    Procedures    Medications  ketorolac (TORADOL) 30 MG/ML injection 30 mg (30 mg Intramuscular Given 02/11/17 1832)  promethazine (PHENERGAN) injection 12.5 mg (12.5 mg Intramuscular Given 02/11/17 1833)     ____________________________________________   INITIAL IMPRESSION / ASSESSMENT AND PLAN / ED COURSE  Pertinent labs & imaging results that were available during my care of the patient were reviewed by me and considered in my medical decision making (see chart for details).  Review of the [place] CSRS was performed in accordance of the [place] prior to dispensing any controlled drugs.      Assessment and plan Right heel pain Headache Patient presents to the emergency department with frontal headache that is occurred intermittently for the past week.  Patient reports that this is not the worst headache of her life.  Patient received injections of both Toradol and Phenergan in the emergency department.  Patient also secondarily reports right heel pain consistent with plantar fasciitis.  Patient was discharged with meloxicam and advised to follow-up with podiatry as needed for right heel pain.  Vital signs are reassuring prior to discharge.  All patient questions were answered.   ____________________________________________  FINAL CLINICAL IMPRESSION(S) / ED DIAGNOSES  Final diagnoses:  Acute nonintractable headache, unspecified headache type  Plantar fasciitis      NEW MEDICATIONS STARTED DURING THIS VISIT:  ED Discharge Orders        Ordered    meloxicam (MOBIC) 15 MG tablet  Daily     02/11/17 1901          This chart was dictated using voice recognition software/Dragon. Despite best efforts to proofread, errors can occur which can change the meaning. Any change was purely unintentional.    Lannie Fields, PA-C  02/11/17 1930    Schuyler Amor, MD 02/11/17 [phone]

## 2017-02-11 NOTE — ED Triage Notes (Signed)
Pt c/o right heel pain for 2 months.  Worse in AM, gets better throughout day.  Also has had headache for 1.5 weeks without relief.  Has tried multiple OTC meds without relief. Some photophobia. No hx headaches prior.  nausea without vomiting. Denies vision changes.  Ambulatory without difficulty.  Pt more concerned about headache.

## 2017-03-08 ENCOUNTER — Emergency Department
Admission: EM | Admit: 2017-03-08 | Discharge: 2017-03-08 | Disposition: A | Discharge status: Home / Self Care | Payer: Self-pay | Attending: Emergency Medicine | Admitting: Emergency Medicine

## 2017-03-08 ENCOUNTER — Emergency Department: Payer: Self-pay

## 2017-03-08 ENCOUNTER — Encounter: Payer: Self-pay | Admitting: Emergency Medicine

## 2017-03-08 ENCOUNTER — Other Ambulatory Visit: Payer: Self-pay

## 2017-03-08 DIAGNOSIS — Z79899 Other long term (current) drug therapy: Secondary | ICD-10-CM | POA: Insufficient documentation

## 2017-03-08 DIAGNOSIS — R519 Headache, unspecified: Secondary | ICD-10-CM

## 2017-03-08 DIAGNOSIS — J45909 Unspecified asthma, uncomplicated: Secondary | ICD-10-CM | POA: Insufficient documentation

## 2017-03-08 DIAGNOSIS — R51 Headache: Secondary | ICD-10-CM | POA: Insufficient documentation

## 2017-03-08 DIAGNOSIS — H538 Other visual disturbances: Secondary | ICD-10-CM | POA: Insufficient documentation

## 2017-03-08 MED ORDER — KETOROLAC TROMETHAMINE 60 MG/2ML IM SOLN
30.0000 mg | Freq: Once | INTRAMUSCULAR | Status: AC
  Administered 2017-03-08: 30 mg via INTRAMUSCULAR
  Filled 2017-03-08: qty 2

## 2017-03-08 MED ORDER — BUTALBITAL-APAP-CAFFEINE 50-325-40 MG PO TABS: 1.0000 | ORAL_TABLET | Freq: Four times a day (QID) | ORAL | 0 refills | Status: DC | PRN

## 2017-03-08 NOTE — ED Provider Notes (Signed)
[facility] Emergency Department Provider Note  ____________________________________________   First MD Initiated Contact with Patient 03/08/17 1830     (approximate)  I have reviewed the triage vital signs and the nursing notes.   HISTORY  Chief Complaint Headache   HPI Dawn Walton is a 21 y.o. female with a history of migraine headaches was presented to the hospital with a frontal headache which she reports is a 7 out of 10.  She says that she has had this headache over the past month and is intermittently associated with blurred vision.  She says that when she had these similar headaches 10 years ago she had a new prescription made and the headaches resolved.  However, the patient says that she had an examination of her eyes in April and was told that she did not need.  The patient says that the light bothers her as well as being nauseous at times.  Denies any visual changes at this time.  Does not report any neck pain.  Says she has tried Mobic as of 1 month ago and it did not relieve the pain.  Says that she did have an injection of Toradol as well as Phenergan about 1 month ago when she was in the emergency department for both headache and ankle pain and this resolved her pain for several days.  She has been trying ibuprofen at home without any relief.  Past Medical History:  Diagnosis Date  . Allergy   . Asthma    unclear per pt  . Obesity   . UTI (urinary tract infection)     Patient Active Problem List   Diagnosis Date Noted  . URI (upper respiratory infection) 05/02/2014  . Candida vaginitis 05/02/2014  . Suprapubic pain 03/21/2014  . Leukopenia 07/03/2013  . Obesity 05/04/2013  . Contraception 05/04/2013  . Menorrhagia 04/14/2013    No past surgical history on file.  Prior to Admission medications   Medication Sig Start Date End Date Taking? Authorizing Provider  albuterol (PROVENTIL HFA;VENTOLIN HFA) 108 (90 BASE) MCG/ACT inhaler  Inhale into the lungs every 6 (six) hours as needed for wheezing or shortness of breath.    [provider]    Allergies Fruit & vegetable daily [nutritional supplements]; Apple; Carrot [daucus carota]; Latex; and Tomato  Family History  Problem Relation Age of Onset  . Hyperlipidemia Maternal Grandmother   . Hypertension Maternal Grandmother   . Diabetes Maternal Grandfather   . Heart disease Maternal Grandfather   . Stroke Maternal Grandfather     Social History Social History   Tobacco Use  . Smoking status: Never Smoker  . Smokeless tobacco: Never Used  Substance Use Topics  . Alcohol use: No    Alcohol/week: 0.0 oz  . Drug use: No    Review of Systems  Constitutional: No fever/chills Eyes: As above ENT: No sore throat. Cardiovascular: Denies chest pain. Respiratory: Denies shortness of breath. Gastrointestinal: No abdominal pain.   no vomiting.  No diarrhea.  No constipation. Genitourinary: Negative for dysuria. Musculoskeletal: Negative for back pain. Skin: Negative for rash. Neurological: Negative for headaches, focal weakness or numbness.   ____________________________________________   PHYSICAL EXAM:  VITAL SIGNS: ED Triage Vitals [03/08/17 1754]  Enc Vitals Group     BP 117/82     Pulse Rate 98     Resp 18     Temp 98.3 F (36.8 C)     Temp Source Oral     SpO2 100 %  Weight 250 lb (113.4 kg)     Height 5\' 2"  (1.575 m)     Head Circumference      Peak Flow      Pain Score 8     Pain Loc      Pain Edu?      Excl. in [place]?     Constitutional: Alert and oriented. Well appearing and in no acute distress. Eyes: Conjunctivae are normal.  Head: Atraumatic. Nose: No congestion/rhinnorhea. Mouth/Throat: Mucous membranes are moist.  Neck: No stridor.   Cardiovascular: Normal rate, regular rhythm. Grossly normal heart sounds.   Respiratory: Normal respiratory effort.  No retractions. Lungs CTAB. Gastrointestinal: Soft and nontender.  No distention.  Musculoskeletal: No lower extremity tenderness nor edema.  No joint effusions. Neurologic:  Normal speech and language. No gross focal neurologic deficits are appreciated. Skin:  Skin is warm, dry and intact. No rash noted. Psychiatric: Mood and affect are normal. Speech and behavior are normal.  ____________________________________________   LABS (all labs ordered are listed, but only abnormal results are displayed)  Labs Reviewed - No data to display ____________________________________________  EKG   ____________________________________________  RADIOLOGY  Partially empty sella which is often a normal anatomic variant but can be seen with idiopathic intracranial hypertension. ____________________________________________   PROCEDURES  Procedure(s) performed:   Procedures  Critical Care performed:   ____________________________________________   INITIAL IMPRESSION / ASSESSMENT AND PLAN / ED COURSE  Pertinent labs & imaging results that were available during my care of the patient were reviewed by me and considered in my medical decision making (see chart for details).  DDX: Headache, brain mass, intracranial hemorrhage, idiopathic intracranial hypertension, migraine headache, tension headache, poor prescription contacts and glasses  As part of my medical decision making, I reviewed the following data within the [place] Notes from prior ED visits  ----------------------------------------- 8:25 PM on 03/08/2017 -----------------------------------------  Patient says that she feels improved after receiving Toradol IM.  I think that idiopathic intracranial hypertension is an appropriate working diagnosis for this patient given that she is in the appropriate demographic.  She is young, African-American and overweight.  I will be giving her neurology follow-up and also ophthalmology follow-up as requested.  She says that her headache is  improved and is not reporting any blurred vision at this time.  I do not believe she needs further workup or that an LP would be warranted given her absence of blurred vision and decreasing symptoms at this time.  She is understanding of the plan as well as the diagnosis and willing to comply.        ____________________________________________   FINAL CLINICAL IMPRESSION(S) / ED DIAGNOSES  Headache.    NEW MEDICATIONS STARTED DURING THIS VISIT:  This SmartLink is deprecated. Use AVSMEDLIST instead to display the medication list for a patient.   Note:  This document was prepared using Dragon voice recognition software and may include unintentional dictation errors.     Orbie Pyo, MD 03/08/17 2026

## 2017-03-08 NOTE — ED Triage Notes (Signed)
Pt reports headache for one month. Denies visual changes. Denies vomiting but reports some nausea. Pt states had migraines when younger but has not had one in 10 years. Ambulatory to triage. No apparent distress noted.

## 2017-03-08 NOTE — ED Notes (Signed)
Pt back form CT

## 2017-03-08 NOTE — ED Notes (Signed)
Pt c/o migraine for the past month and was seen for the same here recently.. States she was not referred for follow up with anyone.

## 2017-04-18 ENCOUNTER — Other Ambulatory Visit: Payer: Self-pay

## 2017-04-18 ENCOUNTER — Emergency Department
Admission: EM | Admit: 2017-04-18 | Discharge: 2017-04-18 | Disposition: A | Discharge status: Home / Self Care | Payer: Self-pay | Attending: Emergency Medicine | Admitting: Emergency Medicine

## 2017-04-18 ENCOUNTER — Encounter: Payer: Self-pay | Admitting: Emergency Medicine

## 2017-04-18 DIAGNOSIS — N390 Urinary tract infection, site not specified: Secondary | ICD-10-CM | POA: Insufficient documentation

## 2017-04-18 DIAGNOSIS — R102 Pelvic and perineal pain: Secondary | ICD-10-CM

## 2017-04-18 DIAGNOSIS — Z91040 Latex allergy status: Secondary | ICD-10-CM | POA: Insufficient documentation

## 2017-04-18 DIAGNOSIS — J45909 Unspecified asthma, uncomplicated: Secondary | ICD-10-CM | POA: Insufficient documentation

## 2017-04-18 LAB — URINALYSIS, ROUTINE W REFLEX MICROSCOPIC
BACTERIA UA: NONE SEEN
Bilirubin Urine: NEGATIVE
Glucose, UA: NEGATIVE mg/dL
Hgb urine dipstick: NEGATIVE
Ketones, ur: NEGATIVE mg/dL
Nitrite: NEGATIVE
Protein, ur: NEGATIVE mg/dL
SPECIFIC GRAVITY, URINE: 1.019 (ref 1.005–1.030)
pH: 7 (ref 5.0–8.0)

## 2017-04-18 LAB — HCG, QUANTITATIVE, PREGNANCY: hCG, Beta Chain, Quant, S: <1 m[IU]/mL (ref <5)

## 2017-04-18 LAB — POCT PREGNANCY, URINE: Preg Test, Ur: NEGATIVE

## 2017-04-18 MED ORDER — NITROFURANTOIN MONOHYD MACRO 100 MG PO CAPS: 100.0000 mg | ORAL_CAPSULE | Freq: Two times a day (BID) | ORAL | 0 refills | Status: DC

## 2017-04-18 NOTE — ED Triage Notes (Signed)
First Nurse Note:  Arrives with C/O pelvic pain x 2-3 days.  Patient is AAOx3.  Skin warm and dry.  Posture upright and relaxed.  NAD

## 2017-04-18 NOTE — ED Provider Notes (Signed)
[facility] Emergency Department Provider Note  ____________________________________________   None    (approximate)  I have reviewed the triage vital signs and the nursing notes.   HISTORY  Chief Complaint Pelvic Pain    HPI Dawn Walton is a 22 y.o. female complains of suprapubic pain, she states she had some cramping and mild spotting last night, her last period was 04/02/17, she denies any vaginal discharge, she denies any burning with urination, she denies fever chills; she states her pregnancies never show up in the urine that she has to have the blood test  Past Medical History:  Diagnosis Date  . Allergy   . Asthma    unclear per pt  . Obesity   . UTI (urinary tract infection)     Patient Active Problem List   Diagnosis Date Noted  . URI (upper respiratory infection) 05/02/2014  . Candida vaginitis 05/02/2014  . Suprapubic pain 03/21/2014  . Leukopenia 07/03/2013  . Obesity 05/04/2013  . Contraception 05/04/2013  . Menorrhagia 04/14/2013    History reviewed. No pertinent surgical history.  Prior to Admission medications   Medication Sig Start Date End Date Taking? Authorizing Provider  albuterol (PROVENTIL HFA;VENTOLIN HFA) 108 (90 BASE) MCG/ACT inhaler Inhale into the lungs every 6 (six) hours as needed for wheezing or shortness of breath.    [provider]  butalbital-acetaminophen-caffeine (FIORICET, ESGIC) 50-325-40 MG tablet Take 1-2 tablets by mouth every 6 (six) hours as needed for headache. 03/08/17 03/08/18  Orbie Pyo, MD  nitrofurantoin, macrocrystal-monohydrate, (MACROBID) 100 MG capsule Take 1 capsule (100 mg total) by mouth 2 (two) times daily. 04/18/17   Versie Starks, PA-C    Allergies Fruit & vegetable daily [nutritional supplements]; Apple; Carrot [daucus carota]; Latex; and Tomato  Family History  Problem Relation Age of Onset  . Hyperlipidemia Maternal Grandmother   . Hypertension  Maternal Grandmother   . Diabetes Maternal Grandfather   . Heart disease Maternal Grandfather   . Stroke Maternal Grandfather     Social History Social History   Tobacco Use  . Smoking status: Never Smoker  . Smokeless tobacco: Never Used  Substance Use Topics  . Alcohol use: No    Alcohol/week: 0.0 oz  . Drug use: No    Review of Systems  Constitutional: No fever/chills Eyes: No visual changes. ENT: No sore throat. Respiratory: Denies cough Genitourinary: Negative for dysuria.  Positive for suprapubic pain Musculoskeletal: Negative for back pain. Skin: Negative for rash.    ____________________________________________   PHYSICAL EXAM:  VITAL SIGNS: ED Triage Vitals  Enc Vitals Group     BP 04/18/17 1411 128/78     Pulse Rate 04/18/17 1411 82     Resp 04/18/17 1411 20     Temp 04/18/17 1411 98.9 F (37.2 C)     Temp Source 04/18/17 1411 Oral     SpO2 04/18/17 1411 98 %     Weight 04/18/17 1411 240 lb (108.9 kg)     Height 04/18/17 1411 5\' 2"  (1.575 m)     Head Circumference --      Peak Flow --      Pain Score 04/18/17 1410 6     Pain Loc --      Pain Edu? --      Excl. in [place]? --     Constitutional: Alert and oriented. Well appearing and in no acute distress. Eyes: Conjunctivae are normal.  Head: Atraumatic. Nose: No congestion/rhinnorhea. Mouth/Throat: Mucous membranes are  moist.   Cardiovascular: Normal rate, regular rhythm.  Heart sounds are normal Respiratory: Normal respiratory effort.  No retractions, lungs clear to auscultation ABD: Soft, tender in suprapubic area only, bowel sounds normal all 4 quads GU: deferred Musculoskeletal: FROM all extremities, warm and well perfused Neurologic:  Normal speech and language.  Skin:  Skin is warm, dry and intact. No rash noted. Psychiatric: Mood and affect are normal. Speech and behavior are normal.  ____________________________________________   LABS (all labs ordered are listed, but only abnormal  results are displayed)  Labs Reviewed  URINALYSIS, ROUTINE W REFLEX MICROSCOPIC - Abnormal; Notable for the following components:      Result Value   Color, Urine YELLOW (*)    APPearance HAZY (*)    Leukocytes, UA SMALL (*)    Squamous Epithelial / LPF 0-5 (*)    All other components within normal limits  HCG, QUANTITATIVE, PREGNANCY  POCT PREGNANCY, URINE  POC URINE PREG, ED   ____________________________________________   ____________________________________________  RADIOLOGY    ____________________________________________   PROCEDURES  Procedure(s) performed: No  Procedures    ____________________________________________   INITIAL IMPRESSION / ASSESSMENT AND PLAN / ED COURSE  Pertinent labs & imaging results that were available during my care of the patient were reviewed by me and considered in my medical decision making (see chart for details).  Patient is a 22 year old female complaining of suprapubic pain, states she had pelvic cramping and some spotting last night, she states that her urine pregnancy does not usually show when she is pregnant she ends up having to get a blood test done, on physical exam patient is tender in the suprapubic area   Urine pregnancy negative, beta-hCG is less than 1, urinalysis shows a small amount of white cells in the urine,  Due to the patient states she has not had a vaginal discharge, diagnosis is UTI, she was given a prescription for Macrobid, she is to follow-up with her regular doctor if not better in 3-5 days, she is to return if worsening  As part of my medical decision making, I reviewed the following data within the [place] reviewed , Old chart reviewed, Notes from prior ED visits ____________________________________________   FINAL CLINICAL IMPRESSION(S) / ED DIAGNOSES  Final diagnoses:  Pelvic pain in female  Lower urinary tract infectious disease      NEW MEDICATIONS STARTED  DURING THIS VISIT:  New Prescriptions   NITROFURANTOIN, MACROCRYSTAL-MONOHYDRATE, (MACROBID) 100 MG CAPSULE    Take 1 capsule (100 mg total) by mouth 2 (two) times daily.     Note:  This document was prepared using Dragon voice recognition software and may include unintentional dictation errors.    Versie Starks, PA-C 04/18/17 1546    Nena Polio, MD 04/18/17 [phone]

## 2017-04-18 NOTE — Discharge Instructions (Signed)
Follow up with your doctor if not better in 3-5 days, return if worsening

## 2017-04-18 NOTE — ED Triage Notes (Signed)
Pt reports pelvic pain since Thursday reports feels like a cramp and radiates to her rectum, reports last night mild spotting, Pt denies any other symptom. Pt talks in complete sentences no distress noted

## 2017-05-12 ENCOUNTER — Ambulatory Visit: Payer: Self-pay | Admitting: Obstetrics & Gynecology

## 2017-08-05 ENCOUNTER — Emergency Department
Admission: EM | Admit: 2017-08-05 | Discharge: 2017-08-05 | Disposition: A | Discharge status: Home / Self Care | Payer: Self-pay | Attending: Emergency Medicine | Admitting: Emergency Medicine

## 2017-08-05 ENCOUNTER — Encounter: Payer: Self-pay | Admitting: Emergency Medicine

## 2017-08-05 ENCOUNTER — Emergency Department: Payer: Self-pay

## 2017-08-05 ENCOUNTER — Other Ambulatory Visit: Payer: Self-pay

## 2017-08-05 DIAGNOSIS — R109 Unspecified abdominal pain: Secondary | ICD-10-CM | POA: Insufficient documentation

## 2017-08-05 DIAGNOSIS — J45909 Unspecified asthma, uncomplicated: Secondary | ICD-10-CM | POA: Insufficient documentation

## 2017-08-05 DIAGNOSIS — R102 Pelvic and perineal pain: Secondary | ICD-10-CM | POA: Insufficient documentation

## 2017-08-05 LAB — COMPREHENSIVE METABOLIC PANEL
ALT: 12 U/L — AB (ref 14–54)
AST: 15 U/L (ref 15–41)
Albumin: 3.6 g/dL (ref 3.5–5.0)
Alkaline Phosphatase: 60 U/L (ref 38–126)
Anion gap: 6 (ref 5–15)
BUN: 13 mg/dL (ref 6–20)
CHLORIDE: 104 mmol/L (ref 101–111)
CO2: 27 mmol/L (ref 22–32)
CREATININE: 0.58 mg/dL (ref 0.44–1.00)
Calcium: 8.4 mg/dL — ABNORMAL LOW (ref 8.9–10.3)
GFR calc non Af Amer: >60 mL/min (ref >60)
Glucose, Bld: 110 mg/dL — ABNORMAL HIGH (ref 65–99)
Potassium: 3.5 mmol/L (ref 3.5–5.1)
SODIUM: 137 mmol/L (ref 135–145)
Total Bilirubin: 0.5 mg/dL (ref 0.3–1.2)
Total Protein: 6 g/dL — ABNORMAL LOW (ref 6.5–8.1)

## 2017-08-05 LAB — CBC WITH DIFFERENTIAL/PLATELET
Basophils Absolute: 0 10*3/uL (ref 0–0.1)
Basophils Relative: 1 %
Eosinophils Absolute: 0.2 10*3/uL (ref 0–0.7)
Eosinophils Relative: 5 %
HEMATOCRIT: 37 % (ref 35.0–47.0)
Hemoglobin: 12.3 g/dL (ref 12.0–16.0)
LYMPHS ABS: 1.5 10*3/uL (ref 1.0–3.6)
LYMPHS PCT: 31 %
MCH: 28.7 pg (ref 26.0–34.0)
MCHC: 33.4 g/dL (ref 32.0–36.0)
MCV: 86.1 fL (ref 80.0–100.0)
Monocytes Absolute: 0.3 10*3/uL (ref 0.2–0.9)
Monocytes Relative: 7 %
Neutro Abs: 2.7 10*3/uL (ref 1.4–6.5)
Neutrophils Relative %: 56 %
Platelets: 400 10*3/uL (ref 150–440)
RBC: 4.3 MIL/uL (ref 3.80–5.20)
RDW: 14.1 % (ref 11.5–14.5)
WBC: 4.8 10*3/uL (ref 3.6–11.0)

## 2017-08-05 LAB — CHLAMYDIA/NGC RT PCR (ARMC ONLY)
Chlamydia Tr: NOT DETECTED
N GONORRHOEAE: NOT DETECTED

## 2017-08-05 LAB — URINALYSIS, ROUTINE W REFLEX MICROSCOPIC
Bacteria, UA: NONE SEEN
Bilirubin Urine: NEGATIVE
Glucose, UA: NEGATIVE mg/dL
Hgb urine dipstick: NEGATIVE
KETONES UR: NEGATIVE mg/dL
Nitrite: NEGATIVE
PROTEIN: NEGATIVE mg/dL
Specific Gravity, Urine: 1.025 (ref 1.005–1.030)
pH: 5 (ref 5.0–8.0)

## 2017-08-05 LAB — PREGNANCY, URINE: Preg Test, Ur: NEGATIVE

## 2017-08-05 LAB — LIPASE, BLOOD: Lipase: 35 U/L (ref 11–51)

## 2017-08-05 MED ORDER — IBUPROFEN 800 MG PO TABS: 800.0000 mg | ORAL_TABLET | Freq: Three times a day (TID) | ORAL | 0 refills | Status: DC | PRN

## 2017-08-05 MED ORDER — OXYCODONE-ACETAMINOPHEN 5-325 MG PO TABS
2.0000 | ORAL_TABLET | Freq: Once | ORAL | Status: AC
  Administered 2017-08-05: 2 via ORAL
  Filled 2017-08-05: qty 2

## 2017-08-05 MED ORDER — SULFAMETHOXAZOLE-TRIMETHOPRIM 800-160 MG PO TABS: 1.0000 | ORAL_TABLET | Freq: Two times a day (BID) | ORAL | 0 refills | Status: DC

## 2017-08-05 NOTE — ED Provider Notes (Signed)
[facility] Provider Note       Time seen: ----------------------------------------- 9:36 AM on 08/05/2017 -----------------------------------------   I have reviewed the triage vital signs and the nursing notes.  HISTORY   Chief Complaint Abdominal Pain    HPI Dawn Walton is a 22 y.o. female with a history of allergies, asthma, obesity and UTI who presents to the ED for abdominal and pelvic pain that is sharp.  Patient states she has had nausea but no vomiting or diarrhea.  Patient states this happens intermittently, happens once every few months.  She has on and off had this for the past 4 years without any specific diagnosis being given.  She has had ultrasounds and pelvic examinations.  She does not feel like she has UTI, currently she is sexually active but is not trying to prevent getting pregnant.  Past Medical History:  Diagnosis Date  . Allergy   . Asthma    unclear per pt  . Obesity   . UTI (urinary tract infection)     Patient Active Problem List   Diagnosis Date Noted  . URI (upper respiratory infection) 05/02/2014  . Candida vaginitis 05/02/2014  . Suprapubic pain 03/21/2014  . Leukopenia 07/03/2013  . Obesity 05/04/2013  . Contraception 05/04/2013  . Menorrhagia 04/14/2013    History reviewed. No pertinent surgical history.  Allergies Fruit & vegetable daily [nutritional supplements]; Apple; Carrot [daucus carota]; Latex; and Tomato  Social History Social History   Tobacco Use  . Smoking status: Never Smoker  . Smokeless tobacco: Never Used  Substance Use Topics  . Alcohol use: No    Alcohol/week: 0.0 oz  . Drug use: No   Review of Systems Constitutional: Negative for fever. Cardiovascular: Negative for chest pain. Respiratory: Negative for shortness of breath. Gastrointestinal: Positive for abdominal pain, nausea Genitourinary: Negative for dysuria. Musculoskeletal: Negative for back  pain. Skin: Negative for rash. Neurological: Negative for headaches, focal weakness or numbness.  All systems negative/normal/unremarkable except as stated in the HPI  ____________________________________________   PHYSICAL EXAM:  VITAL SIGNS: ED Triage Vitals  Enc Vitals Group     BP 08/05/17 0837 110/67     Pulse Rate 08/05/17 0837 80     Resp 08/05/17 0837 18     Temp 08/05/17 0837 98.2 F (36.8 C)     Temp Source 08/05/17 0837 Oral     SpO2 08/05/17 0837 98 %     Weight 08/05/17 0838 267 lb (121.1 kg)     Height 08/05/17 0838 5' 2.5" (1.588 m)     Head Circumference --      Peak Flow --      Pain Score 08/05/17 0854 7     Pain Loc --      Pain Edu? --      Excl. in [place]? --    Constitutional: Alert and oriented. Well appearing and in no distress. Eyes: Conjunctivae are normal. Normal extraocular movements. ENT   Head: Normocephalic and atraumatic.   Nose: No congestion/rhinnorhea.   Mouth/Throat: Mucous membranes are moist.   Neck: No stridor. Cardiovascular: Normal rate, regular rhythm. No murmurs, rubs, or gallops. Respiratory: Normal respiratory effort without tachypnea nor retractions. Breath sounds are clear and equal bilaterally. No wheezes/rales/rhonchi. Gastrointestinal: Soft and nontender. Normal bowel sounds Musculoskeletal: Nontender with normal range of motion in extremities. No lower extremity tenderness nor edema. Neurologic:  Normal speech and language. No gross focal neurologic deficits are appreciated.  Skin:  Skin is warm,  dry and intact. No rash noted. Psychiatric: Mood and affect are normal. Speech and behavior are normal.  ____________________________________________  ED COURSE:  As part of my medical decision making, I reviewed the following data within the [facility] History obtained from family if available, nursing notes, old chart and ekg, as well as notes from prior ED visits. Patient presented for abdominal  pain, we will assess with labs and imaging as indicated at this time.   Procedures ____________________________________________   LABS (pertinent positives/negatives)  Labs Reviewed  COMPREHENSIVE METABOLIC PANEL - Abnormal; Notable for the following components:      Result Value   Glucose, Bld 110 (*)    Calcium 8.4 (*)    Total Protein 6.0 (*)    ALT 12 (*)    All other components within normal limits  URINALYSIS, ROUTINE W REFLEX MICROSCOPIC - Abnormal; Notable for the following components:   Color, Urine YELLOW (*)    APPearance HAZY (*)    Leukocytes, UA LARGE (*)    All other components within normal limits  CHLAMYDIA/NGC RT PCR ([facility] ONLY)  LIPASE, BLOOD  CBC WITH DIFFERENTIAL/PLATELET  PREGNANCY, URINE    RADIOLOGY  Pelvic ultrasound Did not reveal any acute process ____________________________________________  DIFFERENTIAL DIAGNOSIS   Dysmenorrhea, pregnancy, UTI, endometriosis  FINAL ASSESSMENT AND PLAN  Abdominal pain   Plan: The patient had presented for abdominal pain. Patient's labs are reassuring. Patient's imaging was unremarkable.  Patient has borderline urinary tract infection, she will be given 3 days of Septra DS, otherwise she will be referred to GYN for close outpatient follow-up.   Laurence Aly, MD   Note: This note was generated in part or whole with voice recognition software. Voice recognition is usually quite accurate but there are transcription errors that can and very often do occur. I apologize for any typographical errors that were not detected and corrected.     Earleen Newport, MD 08/05/17 [phone]

## 2017-08-05 NOTE — ED Triage Notes (Signed)
Pt to ED with c/o of lower abdominal/pelvic pain that is sharp. Pt states nausea but no vomiting or diarrhea.

## 2017-08-05 NOTE — ED Notes (Signed)
Lab notified to add on Urine Pregnancy 

## 2017-08-05 NOTE — ED Notes (Signed)
Patient transported to Ultrasound 

## 2017-08-26 ENCOUNTER — Other Ambulatory Visit: Payer: Self-pay

## 2017-08-26 ENCOUNTER — Encounter
Admission: RE | Admit: 2017-08-26 | Discharge: 2017-08-26 | Disposition: A | Discharge status: Home / Self Care | Payer: Medicaid Other | Source: Ambulatory Visit | Attending: Obstetrics & Gynecology | Admitting: Obstetrics & Gynecology

## 2017-08-26 DIAGNOSIS — Z0183 Encounter for blood typing: Secondary | ICD-10-CM | POA: Insufficient documentation

## 2017-08-26 DIAGNOSIS — R102 Pelvic and perineal pain: Secondary | ICD-10-CM | POA: Insufficient documentation

## 2017-08-26 DIAGNOSIS — Z91018 Allergy to other foods: Secondary | ICD-10-CM | POA: Insufficient documentation

## 2017-08-26 DIAGNOSIS — Z91040 Latex allergy status: Secondary | ICD-10-CM | POA: Insufficient documentation

## 2017-08-26 DIAGNOSIS — Z01812 Encounter for preprocedural laboratory examination: Secondary | ICD-10-CM | POA: Insufficient documentation

## 2017-08-26 HISTORY — DX: Gastro-esophageal reflux disease without esophagitis: K21.9

## 2017-08-26 HISTORY — DX: Anxiety disorder, unspecified: F41.9

## 2017-08-26 HISTORY — DX: Headache, unspecified: R51.9

## 2017-08-26 HISTORY — DX: Headache: R51

## 2017-08-26 LAB — COMPREHENSIVE METABOLIC PANEL
ALT: 11 U/L — ABNORMAL LOW (ref 14–54)
AST: 17 U/L (ref 15–41)
Albumin: 3.6 g/dL (ref 3.5–5.0)
Alkaline Phosphatase: 69 U/L (ref 38–126)
Anion gap: 7 (ref 5–15)
BUN: 13 mg/dL (ref 6–20)
CHLORIDE: 107 mmol/L (ref 101–111)
CO2: 25 mmol/L (ref 22–32)
CREATININE: 0.54 mg/dL (ref 0.44–1.00)
Calcium: 8.5 mg/dL — ABNORMAL LOW (ref 8.9–10.3)
GFR calc Af Amer: >60 mL/min (ref >60)
GFR calc non Af Amer: >60 mL/min (ref >60)
Glucose, Bld: 97 mg/dL (ref 65–99)
Potassium: 3.4 mmol/L — ABNORMAL LOW (ref 3.5–5.1)
SODIUM: 139 mmol/L (ref 135–145)
Total Bilirubin: 0.4 mg/dL (ref 0.3–1.2)
Total Protein: 6.4 g/dL — ABNORMAL LOW (ref 6.5–8.1)

## 2017-08-26 LAB — CBC WITH DIFFERENTIAL/PLATELET
BASOS ABS: 0.1 10*3/uL (ref 0–0.1)
BASOS PCT: 1 %
EOS ABS: 0.2 10*3/uL (ref 0–0.7)
Eosinophils Relative: 4 %
HCT: 35.4 % (ref 35.0–47.0)
Hemoglobin: 11.9 g/dL — ABNORMAL LOW (ref 12.0–16.0)
Lymphocytes Relative: 33 %
Lymphs Abs: 1.6 10*3/uL (ref 1.0–3.6)
MCH: 28.8 pg (ref 26.0–34.0)
MCHC: 33.5 g/dL (ref 32.0–36.0)
MCV: 86 fL (ref 80.0–100.0)
MONOS PCT: 9 %
Monocytes Absolute: 0.4 10*3/uL (ref 0.2–0.9)
NEUTROS ABS: 2.5 10*3/uL (ref 1.4–6.5)
NEUTROS PCT: 53 %
PLATELETS: 602 10*3/uL — AB (ref 150–440)
RBC: 4.12 MIL/uL (ref 3.80–5.20)
RDW: 14.1 % (ref 11.5–14.5)
WBC: 4.7 10*3/uL (ref 3.6–11.0)

## 2017-08-26 LAB — TYPE AND SCREEN
ABO/RH(D): O POS
ANTIBODY SCREEN: NEGATIVE

## 2017-08-26 NOTE — Pre-Procedure Instructions (Signed)
Informed Dr. Ronelle Nigh of elevated platelet count. He was okay to proceed with anesthesia but has concerns about treatment to prevent DVT's. Lab results faxed to Dr. Tera Helper' office for review.

## 2017-08-26 NOTE — Patient Instructions (Signed)
Your procedure is scheduled BJ:SEGBTD, Aug 27, 2017  Report to [facility].  DO NOT STOP ON THE FIRST FLOOR TO REGISTER.  To find out your arrival time please call [phone] between [place].  Remember: Instructions that are not followed completely may result in serious medical risk,  up to and including death, or upon the discretion of your surgeon and anesthesiologist your  surgery may need to be rescheduled.     _X__ 1. Do not eat food after midnight the night before your procedure.                 No gum chewing or hard candies.                  You may drink clear liquids up to 2 hours                 before you are scheduled to arrive for your surgery-                  DO not drink clear liquids within 2 hours of the start of your surgery.                  Clear Liquids include:  water, apple juice without pulp, clear carbohydrate                 drink such as Clearfast of Gartorade, Black Coffee or Tea (Do not add                 anything to coffee or tea).  __X__2.  On the morning of surgery brush your teeth with toothpaste and water,                     you may rinse your mouth with mouthwash if you wish.                         Do not swallow any toothpaste of mouthwash.     _X__ 3.  No Alcohol for 24 hours before or after surgery.   _X__ 4.  Do Not Smoke or use e-cigarettes For 24 Hours Prior to Your Surgery.                 Do not use any chewable tobacco products for at least 6 hours prior to                 surgery.  ____  5.  Bring all medications with you on the day of surgery if instructed.   ____  6.  Notify your doctor if there is any change in your medical condition      (cold, fever, infections).     Do not wear jewelry, make-up, hairpins, clips or nail polish. Do not wear lotions, powders, or perfumes. You may wear deodorant. Do not shave 48 hours prior to surgery. Men may shave face and neck. Do  not bring valuables to the hospital.    [facility] is not responsible for any belongings or valuables.  Contacts, dentures or bridgework may not be worn into surgery. Leave your suitcase in the car. After surgery it may be brought to your room. For patients admitted to the hospital, discharge time is determined by your treatment team.   Patients discharged the day of surgery will not be allowed to drive home.   Please read over  the following fact sheets that you were given:   PREPARING FOR SURGERY   ____ Take these medicines the morning of surgery with A SIP OF WATER:    1. NONE  2.   3.   4.  5.  6.  ____ Fleet Enema (as directed)   ____ Use CHG Soap as directed  _X___ Stop ASPIRIN PRODUCTS NOW  _X___ Stop Anti-inflammatories NOW               THIS INCLUDES IBUPROFEN       **YOU MAY TAKE TYLENOL**   ____ Stop supplements until after surgery.    ____ Bring C-Pap to the hospital.   [place]

## 2017-08-27 ENCOUNTER — Encounter
Admission: RE | Disposition: A | Discharge status: Home / Self Care | Payer: Self-pay | Source: Ambulatory Visit | Attending: Obstetrics & Gynecology

## 2017-08-27 ENCOUNTER — Other Ambulatory Visit: Payer: Self-pay

## 2017-08-27 ENCOUNTER — Ambulatory Visit
Admission: RE | Admit: 2017-08-27 | Discharge: 2017-08-27 | Disposition: A | Discharge status: Home / Self Care | Payer: Self-pay | Source: Ambulatory Visit | Attending: Obstetrics & Gynecology | Admitting: Obstetrics & Gynecology

## 2017-08-27 ENCOUNTER — Encounter: Payer: Self-pay | Admitting: *Deleted

## 2017-08-27 ENCOUNTER — Ambulatory Visit: Payer: Self-pay | Admitting: Anesthesiology

## 2017-08-27 DIAGNOSIS — Z91018 Allergy to other foods: Secondary | ICD-10-CM | POA: Insufficient documentation

## 2017-08-27 DIAGNOSIS — Z91040 Latex allergy status: Secondary | ICD-10-CM | POA: Insufficient documentation

## 2017-08-27 DIAGNOSIS — N803 Endometriosis of pelvic peritoneum: Secondary | ICD-10-CM | POA: Insufficient documentation

## 2017-08-27 DIAGNOSIS — Z6841 Body Mass Index (BMI) 40.0 and over, adult: Secondary | ICD-10-CM | POA: Insufficient documentation

## 2017-08-27 DIAGNOSIS — Z87892 Personal history of anaphylaxis: Secondary | ICD-10-CM | POA: Insufficient documentation

## 2017-08-27 HISTORY — PX: LAPAROSCOPY: SHX197

## 2017-08-27 LAB — ABO/RH: ABO/RH(D): O POS

## 2017-08-27 LAB — POCT PREGNANCY, URINE: PREG TEST UR: NEGATIVE

## 2017-08-27 SURGERY — LAPAROSCOPY OPERATIVE
Anesthesia: General | Wound class: Clean Contaminated

## 2017-08-27 MED ORDER — ACETAMINOPHEN 650 MG RE SUPP: 650.0000 mg | RECTAL | Status: DC | PRN

## 2017-08-27 MED ORDER — GABAPENTIN 300 MG PO CAPS
600.0000 mg | ORAL_CAPSULE | Freq: Once | ORAL | Status: AC
  Administered 2017-08-27: 600 mg via ORAL

## 2017-08-27 MED ORDER — LACTATED RINGERS IV SOLN: INTRAVENOUS | Status: DC

## 2017-08-27 MED ORDER — ACETAMINOPHEN 500 MG PO TABS
ORAL_TABLET | ORAL | Status: AC
  Administered 2017-08-27: 1000 mg via ORAL
  Filled 2017-08-27: qty 2

## 2017-08-27 MED ORDER — SUCCINYLCHOLINE CHLORIDE 20 MG/ML IJ SOLN
INTRAMUSCULAR | Status: DC | PRN
  Administered 2017-08-27: 120 mg via INTRAVENOUS

## 2017-08-27 MED ORDER — PROPOFOL 10 MG/ML IV BOLUS
INTRAVENOUS | Status: DC | PRN
  Administered 2017-08-27: 200 mg via INTRAVENOUS

## 2017-08-27 MED ORDER — FENTANYL CITRATE (PF) 100 MCG/2ML IJ SOLN
INTRAMUSCULAR | Status: DC | PRN
  Administered 2017-08-27 (×4): 50 ug via INTRAVENOUS

## 2017-08-27 MED ORDER — PHENYLEPHRINE HCL 10 MG/ML IJ SOLN
INTRAMUSCULAR | Status: DC | PRN
  Administered 2017-08-27: 200 ug via INTRAVENOUS

## 2017-08-27 MED ORDER — LIDOCAINE HCL (CARDIAC) PF 100 MG/5ML IV SOSY
PREFILLED_SYRINGE | INTRAVENOUS | Status: DC | PRN
  Administered 2017-08-27: 100 mg via INTRAVENOUS

## 2017-08-27 MED ORDER — FENTANYL CITRATE (PF) 100 MCG/2ML IJ SOLN
INTRAMUSCULAR | Status: AC
  Filled 2017-08-27: qty 2

## 2017-08-27 MED ORDER — MORPHINE SULFATE (PF) 4 MG/ML IV SOLN: 1.0000 mg | INTRAVENOUS | Status: DC | PRN

## 2017-08-27 MED ORDER — HEPARIN SODIUM (PORCINE) 5000 UNIT/ML IJ SOLN
INTRAMUSCULAR | Status: AC
  Filled 2017-08-27: qty 1

## 2017-08-27 MED ORDER — KETOROLAC TROMETHAMINE 30 MG/ML IJ SOLN
INTRAMUSCULAR | Status: AC
  Administered 2017-08-27: 30 mg via INTRAVENOUS
  Filled 2017-08-27: qty 1

## 2017-08-27 MED ORDER — CELECOXIB 200 MG PO CAPS
400.0000 mg | ORAL_CAPSULE | Freq: Once | ORAL | Status: AC
  Administered 2017-08-27: 400 mg via ORAL

## 2017-08-27 MED ORDER — DEXAMETHASONE SODIUM PHOSPHATE 10 MG/ML IJ SOLN
INTRAMUSCULAR | Status: DC | PRN
  Administered 2017-08-27: 10 mg via INTRAVENOUS

## 2017-08-27 MED ORDER — SILVER NITRATE-POT NITRATE 75-25 % EX MISC
CUTANEOUS | Status: AC
  Filled 2017-08-27: qty 1

## 2017-08-27 MED ORDER — IBUPROFEN 800 MG PO TABS: 800.0000 mg | ORAL_TABLET | Freq: Four times a day (QID) | ORAL | 0 refills | Status: DC

## 2017-08-27 MED ORDER — HEPARIN SODIUM (PORCINE) 5000 UNIT/ML IJ SOLN
5000.0000 [IU] | Freq: Once | INTRAMUSCULAR | Status: AC
  Administered 2017-08-27: 5000 [IU] via SUBCUTANEOUS

## 2017-08-27 MED ORDER — KETOROLAC TROMETHAMINE 30 MG/ML IJ SOLN
30.0000 mg | Freq: Four times a day (QID) | INTRAMUSCULAR | Status: DC
  Administered 2017-08-27: 30 mg via INTRAVENOUS
  Filled 2017-08-27: qty 1

## 2017-08-27 MED ORDER — ACETAMINOPHEN 325 MG PO TABS: 650.0000 mg | ORAL_TABLET | ORAL | Status: DC | PRN

## 2017-08-27 MED ORDER — SUGAMMADEX SODIUM 200 MG/2ML IV SOLN
INTRAVENOUS | Status: DC | PRN
  Administered 2017-08-27: 200 mg via INTRAVENOUS

## 2017-08-27 MED ORDER — FENTANYL CITRATE (PF) 100 MCG/2ML IJ SOLN
25.0000 ug | INTRAMUSCULAR | Status: DC | PRN
  Administered 2017-08-27 (×4): 25 ug via INTRAVENOUS

## 2017-08-27 MED ORDER — MIDAZOLAM HCL 2 MG/2ML IJ SOLN
INTRAMUSCULAR | Status: AC
  Filled 2017-08-27: qty 2

## 2017-08-27 MED ORDER — MIDAZOLAM HCL 2 MG/2ML IJ SOLN
INTRAMUSCULAR | Status: DC | PRN
  Administered 2017-08-27: 2 mg via INTRAVENOUS

## 2017-08-27 MED ORDER — FAMOTIDINE 20 MG PO TABS
ORAL_TABLET | ORAL | Status: AC
  Administered 2017-08-27: 20 mg via ORAL
  Filled 2017-08-27: qty 1

## 2017-08-27 MED ORDER — GLYCOPYRROLATE 0.2 MG/ML IJ SOLN
INTRAMUSCULAR | Status: DC | PRN
  Administered 2017-08-27: 0.2 mg via INTRAVENOUS

## 2017-08-27 MED ORDER — GABAPENTIN 300 MG PO CAPS
ORAL_CAPSULE | ORAL | Status: AC
  Administered 2017-08-27: 600 mg via ORAL
  Filled 2017-08-27: qty 2

## 2017-08-27 MED ORDER — ACETAMINOPHEN 500 MG PO TABS
1000.0000 mg | ORAL_TABLET | Freq: Once | ORAL | Status: AC
  Administered 2017-08-27: 1000 mg via ORAL

## 2017-08-27 MED ORDER — CELECOXIB 200 MG PO CAPS
ORAL_CAPSULE | ORAL | Status: AC
  Administered 2017-08-27: 400 mg via ORAL
  Filled 2017-08-27: qty 2

## 2017-08-27 MED ORDER — ONDANSETRON HCL 4 MG/2ML IJ SOLN: 4.0000 mg | Freq: Once | INTRAMUSCULAR | Status: DC | PRN

## 2017-08-27 MED ORDER — ROCURONIUM BROMIDE 100 MG/10ML IV SOLN
INTRAVENOUS | Status: DC | PRN
  Administered 2017-08-27 (×2): 10 mg via INTRAVENOUS
  Administered 2017-08-27: 25 mg via INTRAVENOUS
  Administered 2017-08-27 (×2): 10 mg via INTRAVENOUS
  Administered 2017-08-27: 5 mg via INTRAVENOUS

## 2017-08-27 MED ORDER — PROPOFOL 10 MG/ML IV BOLUS
INTRAVENOUS | Status: AC
  Filled 2017-08-27: qty 20

## 2017-08-27 MED ORDER — OXYCODONE HCL 5 MG PO TABS: 5.0000 mg | ORAL_TABLET | ORAL | 0 refills | Status: DC | PRN

## 2017-08-27 MED ORDER — ONDANSETRON HCL 4 MG/2ML IJ SOLN
INTRAMUSCULAR | Status: DC | PRN
  Administered 2017-08-27: 4 mg via INTRAVENOUS

## 2017-08-27 MED ORDER — FAMOTIDINE 20 MG PO TABS
20.0000 mg | ORAL_TABLET | Freq: Once | ORAL | Status: AC
  Administered 2017-08-27: 20 mg via ORAL

## 2017-08-27 MED ORDER — LACTATED RINGERS IV SOLN
INTRAVENOUS | Status: DC
  Administered 2017-08-27 (×2): via INTRAVENOUS

## 2017-08-27 MED ORDER — FENTANYL CITRATE (PF) 100 MCG/2ML IJ SOLN
INTRAMUSCULAR | Status: AC
  Administered 2017-08-27: 25 ug via INTRAVENOUS
  Filled 2017-08-27: qty 2

## 2017-08-27 SURGICAL SUPPLY — 44 items
BAG URINE DRAINAGE (UROLOGICAL SUPPLIES) ×3 IMPLANT
BLADE SURG SZ11 CARB STEEL (BLADE) ×3 IMPLANT
CANISTER SUCT 1200ML W/VALVE (MISCELLANEOUS) ×3 IMPLANT
CATH FOLEY 2WAY  5CC 16FR (CATHETERS) ×2
CATH URTH 16FR FL 2W BLN LF (CATHETERS) ×1 IMPLANT
CHLORAPREP W/TINT 26ML (MISCELLANEOUS) ×3 IMPLANT
DEFOGGER SCOPE WARMER CLEARIFY (MISCELLANEOUS) ×3 IMPLANT
DERMABOND ADVANCED (GAUZE/BANDAGES/DRESSINGS)
DERMABOND ADVANCED .7 DNX12 (GAUZE/BANDAGES/DRESSINGS) IMPLANT
DRAPE LEGGINS SURG 28X43 STRL (DRAPES) ×3 IMPLANT
DRAPE UNDER BUTTOCK W/FLU (DRAPES) ×3 IMPLANT
GLOVE PI ORTHOPRO 6.5 (GLOVE) ×2
GLOVE PI ORTHOPRO STRL 6.5 (GLOVE) ×1 IMPLANT
GLOVE SURG SYN 6.5 ES PF (GLOVE) ×12 IMPLANT
GOWN STRL REUS W/ TWL LRG LVL3 (GOWN DISPOSABLE) ×2 IMPLANT
GOWN STRL REUS W/TWL LRG LVL3 (GOWN DISPOSABLE) ×4
GRASPER SUT TROCAR 14GX15 (MISCELLANEOUS) IMPLANT
IRRIGATION STRYKERFLOW (MISCELLANEOUS) IMPLANT
IRRIGATOR STRYKERFLOW (MISCELLANEOUS)
IV NS 1000ML (IV SOLUTION)
IV NS 1000ML BAXH (IV SOLUTION) IMPLANT
KIT PINK PAD W/HEAD ARE REST (MISCELLANEOUS) ×3
KIT PINK PAD W/HEAD ARM REST (MISCELLANEOUS) ×1 IMPLANT
KIT TURNOVER CYSTO (KITS) ×3 IMPLANT
L-HOOK LAP DISP 36CM (ELECTROSURGICAL)
LABEL OR SOLS (LABEL) IMPLANT
LHOOK LAP DISP 36CM (ELECTROSURGICAL) IMPLANT
LIGASURE VESSEL 5MM BLUNT TIP (ELECTROSURGICAL) IMPLANT
MANIPULATOR UTERINE 4.5 ZUMI (MISCELLANEOUS) ×3 IMPLANT
NEEDLE HYPO 22GX1.5 SAFETY (NEEDLE) ×3 IMPLANT
NS IRRIG 500ML POUR BTL (IV SOLUTION) ×3 IMPLANT
PACK LAP CHOLECYSTECTOMY (MISCELLANEOUS) ×3 IMPLANT
PAD OB MATERNITY 4.3X12.25 (PERSONAL CARE ITEMS) ×3 IMPLANT
PAD PREP 24X41 OB/GYN DISP (PERSONAL CARE ITEMS) ×3 IMPLANT
PENCIL ELECTRO HAND CTR (MISCELLANEOUS) ×6 IMPLANT
POUCH SPECIMEN RETRIEVAL 10MM (ENDOMECHANICALS) IMPLANT
SCISSORS METZENBAUM CVD 33 (INSTRUMENTS) IMPLANT
SLEEVE ENDOPATH XCEL 5M (ENDOMECHANICALS) ×6 IMPLANT
SUT MNCRL AB 4-0 PS2 18 (SUTURE) IMPLANT
SUT VIC AB 2-0 UR6 27 (SUTURE) IMPLANT
SYR 10ML LL (SYRINGE) ×3 IMPLANT
TROCAR ENDO BLADELESS 11MM (ENDOMECHANICALS) IMPLANT
TROCAR XCEL NON-BLD 5MMX100MML (ENDOMECHANICALS) ×3 IMPLANT
TUBING INSUFFLATION (TUBING) ×3 IMPLANT

## 2017-08-27 NOTE — Anesthesia Procedure Notes (Addendum)
Procedure Name: Intubation Date/Time: 08/27/2017 11:39 AM Performed by: Nelda Marseille, CRNA Pre-anesthesia Checklist: Patient identified, Emergency Drugs available, Suction available, Timeout performed and Patient being monitored Patient Re-evaluated:Patient Re-evaluated prior to induction Oxygen Delivery Method: Circle system utilized Preoxygenation: Pre-oxygenation with 100% oxygen Induction Type: IV induction Ventilation: Mask ventilation without difficulty Laryngoscope Size: McGraph and 3 Grade View: Grade I Tube type: Oral Tube size: 7.0 mm Number of attempts: 1 Airway Equipment and Method: Stylet and Video-laryngoscopy Placement Confirmation: ETT inserted through vocal cords under direct vision and positive ETCO2 Secured at: 21 cm Tube secured with: Tape Dental Injury: Teeth and Oropharynx as per pre-operative assessment

## 2017-08-27 NOTE — Discharge Instructions (Signed)
Discharge instructions:  Call office if you have any of the following: fever >101 F, chills, excessive vaginal bleeding, incision drainage or problems, leg pain or redness, or any other concerns.   Activity: Do not lift > 10 lbs for 2 weeks.  No driving until you are certain you can slam on the brakes    You may feel some pain in your upper right abdomen/rib and right shoulder.  This is from the gas in the abdomen for surgery. This will subside over time, please be patient!  Take 600mg  Ibuprofen and 1000mg  Tylenol around the clock, every 6 hours for at least the first 3-5 days.  After this you can take as needed.  This will help decrease inflammation and promote healing.  The narcotics you'll take just as needed, as they just trick your brain into thinking its not in pain.    Please don't limit yourself in terms of routine activity.  You will be able to do most things, although they may take longer to do or be a little painful.  You can do it!  AMBULATORY SURGERY  DISCHARGE INSTRUCTIONS   1) The drugs that you were given will stay in your system until tomorrow so for the next 24 hours you should not:  A) Drive an automobile B) Make any legal decisions C) Drink any alcoholic beverage   2) You may resume regular meals tomorrow.  Today it is better to start with liquids and gradually work up to solid foods.  You may eat anything you prefer, but it is better to start with liquids, then soup and crackers, and gradually work up to solid foods.   3) Please notify your doctor immediately if you have any unusual bleeding, trouble breathing, redness and pain at the surgery site, drainage, fever, or pain not relieved by medication.    4) Additional Instructions:        Please contact your physician with any problems or Same Day Surgery at [phone], Monday through Friday 6 am to 4 pm, or [place] at [facility] number at [phone].  Don't be a hero, but don't be a wimp  either!

## 2017-08-27 NOTE — Anesthesia Post-op Follow-up Note (Signed)
Anesthesia QCDR form completed.        

## 2017-08-27 NOTE — Op Note (Addendum)
[facility] PROCEDURE DATE: 08/27/2017  PATIENT:  Dawn Walton  22 y.o. female  PRE-OPERATIVE DIAGNOSIS:  pelvic pain  POST-OPERATIVE DIAGNOSIS:  ASRM Stage 2 endometriosis  PROCEDURE:  Procedure(s): LAPAROSCOPY OPERATIVE; ABLATION OF ENDOMETRIAL IMPLANTS; STRIPPING OF PERITONEUM (N/A)  SURGEON:  Surgeon(s) and Role:    * Marili Vader, Honor Loh, MD - Primary  ANESTHESIA:  General via ET  I/O  1000cc crystalloid, 500cc UOP, <5cc EBL.  FINDINGS:  Small uterus, slightly enlarged ovaries with follicular cysts and normal fallopian tubes bilaterally.  Normal upper abdomen.  Long round ligaments bilaterally. Black/blue Hemosiderin implants with retraction on bilateral peritoneum in posterior culdesac. Brown staining implants along anterior peritoneum.  All in 1-3 cm clusters, 4 total in posterior and 1 in anterior.  Neither ovary or fallopian tube were scarred or had evidence of endometrial implants.  The anterior peritoneum was pulled taut due to scarring and retraction of tissues. Normal appearing appendix  SPECIMEN:  Posterior culdesac hemosiderin implants Anterior peritoneum  COMPLICATIONS: none apparent  DISPOSITION: vital signs stable to PACU   Indication for Surgery: 22 y.o. [id number] with chronic and worsening pelvic pain, requesting surgical exploration for diagnosis.  She had presented to ED with pain in the prior week.  Risks of surgery were discussed with the patient including but not limited to: bleeding which may require transfusion or reoperation; infection which may require antibiotics; injury to bowel, bladder, ureters or other surrounding organs; need for additional procedures including laparotomy, blood clot, incisional problems and other postoperative/anesthesia complications. Written informed consent was obtained.     PROCEDURE IN DETAIL:  The patient had sequential compression devices applied to her lower extremities while in the preoperative area.  She was then  taken to the operating room where general anesthesia was administered.  She was placed in the dorsal lithotomy position, and was prepped and draped in a sterile manner.  A Foley catheter was inserted into her bladder and attached to constant drainage and a Zumi uterine manipulator was then advanced into the uterus .  After a surgical timeout was performed, attention was turned to the abdomen where an umbilical incision was made with the scalpel. A 33mm trochar was inserted in the umbilical incision using a visiport method. Opening pressure was 59mmHg, and the abdomen was insufflated to 15mg Hg carbon dioxide gas and adequate pneumoperitoneum was obtained.  A survey of the patient's pelvis and abdomen revealed the findings as mentioned above. Two 46mm ports were inserted in the lower left and right quadrants under visualization.    The Bovie cautery hook was used to dissect and divide the peritoneum from the area just cephelad from the retracted peritoneum anteriorly, across the vesicouterine flap, and to the contralateral retracted tissue, including some of the anterior broad ligament.  The entire anterior culdesac peritoneum was removed.  Care was made to minimize the amount of adipose tissue removed.   The uterus was manipulated and held anteverted, and the peritoneum was carefully undermined around the hemosiderin deposits.  The posterior broad ligament was also removed in the areas closest to the uterine arteries bilaterally.  These were each identified, as was the ureter on each side, to ensure that potential thermal damage was avoided.    At one point, the bovie hook touched the sigmoid while still hot.  This area was inspected carefully.  There was visible damage to the serosa, but at minimal thickness- no penetration through the sersoal layer into the lumen was identified.  Dr. Bary Castilla from surgery was consulted  intra-operatively, and advised no further action - that suture would be potentially more  damaging.    All specimens were removed from the abdomen.  The operative sites was surveyed, and were found to be hemostatic.  No intraoperative injury to surrounding organs was noted.  The abdomen was desufflated and all instruments were then removed from the patient's abdomen. The uterine manipulator was removed without complications.  Due to a history of keloid, suture was avoided, and all skin incisions were covered with surgical glue. The patient tolerated the procedure well.  All instruments, needles, and sponge counts were correct x 2. The patient was taken to the recovery room in stable condition.   ---- Larey Days, MD Attending Obstetrician and [facility]

## 2017-08-27 NOTE — Transfer of Care (Signed)
Immediate Anesthesia Transfer of Care Note  Patient: Dawn [facility]  Procedure(s) Performed: LAPAROSCOPY OPERATIVE; ABLATION OF ENDOMETRIAL IMPLANTS; STRIPPING OF PERITONEUM (N/A )  Patient Location: PACU  Anesthesia Type:General  Level of Consciousness: sedated  Airway & Oxygen Therapy: Patient Spontanous Breathing and Patient connected to face mask oxygen  Post-op Assessment: Report given to RN and Post -op Vital signs reviewed and stable  Post vital signs: Reviewed and stable  Last Vitals:  Vitals Value Taken Time  BP 141/82 08/27/2017  2:16 PM  Temp    Pulse 95 08/27/2017  2:17 PM  Resp 23 08/27/2017  2:17 PM  SpO2 99 % 08/27/2017  2:17 PM  Vitals shown include unvalidated device data.  Last Pain:  Vitals:   08/27/17 0823  TempSrc: Temporal  PainSc: 4          Complications: No apparent anesthesia complications

## 2017-08-27 NOTE — H&P (Signed)
Preoperative History and Physical  Dawn Walton is a 22 y.o. G2P0020 here for surgical management of chronic pelvic pain.   No significant preoperative concerns.  Proposed surgery: diagnostic laparoscopy  Past Medical History:  Diagnosis Date  . Allergy   . Anxiety   . Asthma    unclear per pt  . GERD (gastroesophageal reflux disease)   . Headache    MIGRAINES OCCASIONALLY  . Obesity   . UTI (urinary tract infection)    Past Surgical History:  Procedure Laterality Date  . NO PAST SURGERIES     OB History  Gravida Para Term Preterm AB Living  2       2 0  SAB TAB Ectopic Multiple Live Births               # Outcome Date GA Lbr Len/2nd Weight Sex Delivery Anes PTL Lv  2 AB           1 AB           Patient denies any other pertinent gynecologic issues.   No current facility-administered medications on file prior to encounter.    Current Outpatient Medications on File Prior to Encounter  Medication Sig Dispense Refill  . albuterol (PROVENTIL HFA;VENTOLIN HFA) 108 (90 BASE) MCG/ACT inhaler Inhale into the lungs every 6 (six) hours as needed for wheezing or shortness of breath.    . butalbital-acetaminophen-caffeine (FIORICET, ESGIC) 50-325-40 MG tablet Take 1-2 tablets by mouth every 6 (six) hours as needed for headache. (Patient not taking: Reported on 08/05/2017) 20 tablet 0  . ibuprofen (ADVIL,MOTRIN) 200 MG tablet Take 200 mg by mouth every 6 (six) hours as needed for moderate pain.    Marland Kitchen ibuprofen (ADVIL,MOTRIN) 800 MG tablet Take 1 tablet (800 mg total) by mouth every 8 (eight) hours as needed. (Patient not taking: Reported on 08/24/2017) 30 tablet 0  . nitrofurantoin, macrocrystal-monohydrate, (MACROBID) 100 MG capsule Take 1 capsule (100 mg total) by mouth 2 (two) times daily. (Patient not taking: Reported on 08/05/2017) 14 capsule 0  . sulfamethoxazole-trimethoprim (BACTRIM DS) 800-160 MG tablet Take 1 tablet by mouth 2 (two) times daily. (Patient not taking: Reported on  08/24/2017) 6 tablet 0   Allergies  Allergen Reactions  . Apple Other (See Comments)    Lip swelling  . Carrot [Daucus Carota] Other (See Comments)    Lip swelling  . Celery Oil Anaphylaxis  . Tomato Hives  . Latex Hives    Social History:   reports that she has never smoked. She has never used smokeless tobacco. She reports that she does not drink alcohol or use drugs.  Family History  Problem Relation Age of Onset  . Hyperlipidemia Maternal Grandmother   . Hypertension Maternal Grandmother   . Diabetes Maternal Grandfather   . Heart disease Maternal Grandfather   . Stroke Maternal Grandfather     Review of Systems: Noncontributory  PHYSICAL EXAM: Blood pressure 126/81, pulse 79, temperature 97.8 F (36.6 C), temperature source Temporal, resp. rate 16, height 5' 2.5" (1.588 m), weight 124.7 kg (274 lb 14.4 oz), last menstrual period 08/19/2017, SpO2 99 %, unknown if currently breastfeeding. General appearance - alert, well appearing, and in no distress Chest - clear to auscultation, no wheezes, rales or rhonchi, symmetric air entry Heart - normal rate and regular rhythm Abdomen - soft, nontender, nondistended, no masses or organomegaly Pelvic - examination not indicated Extremities - peripheral pulses normal, no pedal edema, no clubbing or cyanosis  Labs: Results for  orders placed or performed during the hospital encounter of 08/27/17 (from the past 336 hour(s))  Pregnancy, urine POC   Collection Time: 08/27/17  8:13 AM  Result Value Ref Range   Preg Test, Ur NEGATIVE NEGATIVE  ABO/Rh   Collection Time: 08/27/17  8:14 AM  Result Value Ref Range   ABO/RH(D)      O POS Performed at [facility], [address] [place], [place] [address]   Results for orders placed or performed during the hospital encounter of 08/26/17 (from the past 336 hour(s))  CBC with Differential/Platelet   Collection Time: 08/26/17  8:43 AM  Result Value Ref Range   WBC 4.7 3.6 -  11.0 K/uL   RBC 4.12 3.80 - 5.20 MIL/uL   Hemoglobin 11.9 (L) 12.0 - 16.0 g/dL   HCT 35.4 35.0 - 47.0 %   MCV 86.0 80.0 - 100.0 fL   MCH 28.8 26.0 - 34.0 pg   MCHC 33.5 32.0 - 36.0 g/dL   RDW 14.1 11.5 - 14.5 %   Platelets 602 (H) 150 - 440 K/uL   Neutrophils Relative % 53 %   Neutro Abs 2.5 1.4 - 6.5 K/uL   Lymphocytes Relative 33 %   Lymphs Abs 1.6 1.0 - 3.6 K/uL   Monocytes Relative 9 %   Monocytes Absolute 0.4 0.2 - 0.9 K/uL   Eosinophils Relative 4 %   Eosinophils Absolute 0.2 0 - 0.7 K/uL   Basophils Relative 1 %   Basophils Absolute 0.1 0 - 0.1 K/uL  Comprehensive metabolic panel   Collection Time: 08/26/17  8:43 AM  Result Value Ref Range   Sodium 139 135 - 145 mmol/L   Potassium 3.4 (L) 3.5 - 5.1 mmol/L   Chloride 107 101 - 111 mmol/L   CO2 25 22 - 32 mmol/L   Glucose, Bld 97 65 - 99 mg/dL   BUN 13 6 - 20 mg/dL   Creatinine, Ser 0.54 0.44 - 1.00 mg/dL   Calcium 8.5 (L) 8.9 - 10.3 mg/dL   Total Protein 6.4 (L) 6.5 - 8.1 g/dL   Albumin 3.6 3.5 - 5.0 g/dL   AST 17 15 - 41 U/L   ALT 11 (L) 14 - 54 U/L   Alkaline Phosphatase 69 38 - 126 U/L   Total Bilirubin 0.4 0.3 - 1.2 mg/dL   GFR calc non Af Amer >60 >60 mL/min   GFR calc Af Amer >60 >60 mL/min   Anion gap 7 5 - 15  Type and screen Perry   Collection Time: 08/26/17  8:43 AM  Result Value Ref Range   ABO/RH(D) O POS    Antibody Screen NEG    Sample Expiration 09/09/2017    Extend sample reason      NO TRANSFUSIONS OR PREGNANCY IN THE PAST 3 MONTHS Performed at [facility], [address], [place], [place] [address]     Imaging Studies: US Pelvis Transvanginal Non-ob (tv Only)  Result Date: 08/05/2017 CLINICAL DATA:  Chronic intermittent pelvic pain with increasing severity today. EXAM: TRANSABDOMINAL AND TRANSVAGINAL ULTRASOUND OF PELVIS DOPPLER ULTRASOUND OF OVARIES TECHNIQUE: Both transabdominal and transvaginal ultrasound examinations of the pelvis were performed.  Transabdominal technique was performed for global imaging of the pelvis including uterus, ovaries, adnexal regions, and pelvic cul-de-sac. It was necessary to proceed with endovaginal exam following the transabdominal exam to visualize the uterus, endometrium, ovaries, and adnexal structures. Color and duplex Doppler ultrasound was utilized to evaluate blood flow to  the ovaries. COMPARISON:  Pelvic ultrasound of April 26, 2016 FINDINGS: Uterus Measurements: 7.0 x 2.9 x 4.0 cm. No fibroids or other mass visualized. Endometrium Thickness: 8.5 mm.  No focal abnormality visualized. Right ovary Measurements: 3.4 x 2.5 x 2.7 cm. Normal appearance/no adnexal mass. Normal appearing follicles are present. Left ovary Measurements: 3.7 x 2.5 x 3.2 cm. Normal appearance/no adnexal mass. Normal appearing follicles are present. Pulsed Doppler evaluation of both ovaries demonstrates normal low-resistance arterial and venous waveforms. Other findings There is a tiny amount of free pelvic fluid. IMPRESSION: Normal appearance of the uterus and ovaries. Normal vascularity of these pelvic structures. Electronically Signed   By: David  Martinique M.D.   On: 08/05/2017 11:12   US Pelvis Complete  Result Date: 08/05/2017 CLINICAL DATA:  Chronic intermittent pelvic pain with increasing severity today. EXAM: TRANSABDOMINAL AND TRANSVAGINAL ULTRASOUND OF PELVIS DOPPLER ULTRASOUND OF OVARIES TECHNIQUE: Both transabdominal and transvaginal ultrasound examinations of the pelvis were performed. Transabdominal technique was performed for global imaging of the pelvis including uterus, ovaries, adnexal regions, and pelvic cul-de-sac. It was necessary to proceed with endovaginal exam following the transabdominal exam to visualize the uterus, endometrium, ovaries, and adnexal structures. Color and duplex Doppler ultrasound was utilized to evaluate blood flow to the ovaries. COMPARISON:  Pelvic ultrasound of April 26, 2016 FINDINGS: Uterus  Measurements: 7.0 x 2.9 x 4.0 cm. No fibroids or other mass visualized. Endometrium Thickness: 8.5 mm.  No focal abnormality visualized. Right ovary Measurements: 3.4 x 2.5 x 2.7 cm. Normal appearance/no adnexal mass. Normal appearing follicles are present. Left ovary Measurements: 3.7 x 2.5 x 3.2 cm. Normal appearance/no adnexal mass. Normal appearing follicles are present. Pulsed Doppler evaluation of both ovaries demonstrates normal low-resistance arterial and venous waveforms. Other findings There is a tiny amount of free pelvic fluid. IMPRESSION: Normal appearance of the uterus and ovaries. Normal vascularity of these pelvic structures. Electronically Signed   By: David  Martinique M.D.   On: 08/05/2017 11:12   US Pelvic Doppler (torsion R/o Or Mass Arterial Flow)  Result Date: 08/05/2017 CLINICAL DATA:  Chronic intermittent pelvic pain with increasing severity today. EXAM: TRANSABDOMINAL AND TRANSVAGINAL ULTRASOUND OF PELVIS DOPPLER ULTRASOUND OF OVARIES TECHNIQUE: Both transabdominal and transvaginal ultrasound examinations of the pelvis were performed. Transabdominal technique was performed for global imaging of the pelvis including uterus, ovaries, adnexal regions, and pelvic cul-de-sac. It was necessary to proceed with endovaginal exam following the transabdominal exam to visualize the uterus, endometrium, ovaries, and adnexal structures. Color and duplex Doppler ultrasound was utilized to evaluate blood flow to the ovaries. COMPARISON:  Pelvic ultrasound of April 26, 2016 FINDINGS: Uterus Measurements: 7.0 x 2.9 x 4.0 cm. No fibroids or other mass visualized. Endometrium Thickness: 8.5 mm.  No focal abnormality visualized. Right ovary Measurements: 3.4 x 2.5 x 2.7 cm. Normal appearance/no adnexal mass. Normal appearing follicles are present. Left ovary Measurements: 3.7 x 2.5 x 3.2 cm. Normal appearance/no adnexal mass. Normal appearing follicles are present. Pulsed Doppler evaluation of both ovaries  demonstrates normal low-resistance arterial and venous waveforms. Other findings There is a tiny amount of free pelvic fluid. IMPRESSION: Normal appearance of the uterus and ovaries. Normal vascularity of these pelvic structures. Electronically Signed   By: David  Martinique M.D.   On: 08/05/2017 11:12    Assessment: Patient Active Problem List   Diagnosis Date Noted  . URI (upper respiratory infection) 05/02/2014  . Candida vaginitis 05/02/2014  . Suprapubic pain 03/21/2014  . Leukopenia 07/03/2013  . Obesity 05/04/2013  .  Contraception 05/04/2013  . Menorrhagia 04/14/2013    Plan: Patient will undergo surgical management with diagnostic laparoscopy with biopsies and other procedures as indicated.   The risks of surgery were discussed in detail with the patient including but not limited to: bleeding which may require transfusion or reoperation; infection which may require antibiotics; injury to surrounding organs which may involve bowel, bladder, ureters ; need for additional procedures including laparoscopy or laparotomy; thromboembolic phenomenon, surgical site problems and other postoperative/anesthesia complications. Likelihood of success in alleviating the patient's condition was discussed. Routine postoperative instructions will be reviewed with the patient and her family in detail after surgery.  The patient concurred with the proposed plan, giving informed written consent for the surgery.  Patient has been NPO since last night she will remain NPO for procedure.  Anesthesia and OR aware.  To OR when ready.   ----- Larey Days, MD Attending Obstetrician and Gynecologist [facility], Department of [facility]

## 2017-08-27 NOTE — Anesthesia Preprocedure Evaluation (Signed)
Anesthesia Evaluation  Patient identified by MRN, date of birth, ID band Patient awake    Reviewed: Allergy & Precautions, NPO status , Patient's Chart, lab work & pertinent test results  Airway Mallampati: II  TM Distance: >3 FB     Dental  (+) Teeth Intact   Pulmonary asthma ,    Pulmonary exam normal        Cardiovascular negative cardio ROS Normal cardiovascular exam     Neuro/Psych  Headaches, Anxiety    GI/Hepatic Neg liver ROS, GERD  ,  Endo/Other  Morbid obesity  Renal/GU negative Renal ROS  Female GU complaint     Musculoskeletal negative musculoskeletal ROS (+)   Abdominal Normal abdominal exam  (+)   Peds negative pediatric ROS (+)  Hematology negative hematology ROS (+)   Anesthesia Other Findings   Reproductive/Obstetrics                             Anesthesia Physical Anesthesia Plan  ASA: III  Anesthesia Plan: General   Post-op Pain Management:    Induction: Intravenous, Rapid sequence and Cricoid pressure planned  PONV Risk Score and Plan:   Airway Management Planned: Oral ETT  Additional Equipment:   Intra-op Plan:   Post-operative Plan: Extubation in OR  Informed Consent: I have reviewed the patients History and Physical, chart, labs and discussed the procedure including the risks, benefits and alternatives for the proposed anesthesia with the patient or authorized representative who has indicated his/her understanding and acceptance.   Dental advisory given  Plan Discussed with: CRNA and Surgeon  Anesthesia Plan Comments:         Anesthesia Quick Evaluation

## 2017-08-28 NOTE — Anesthesia Postprocedure Evaluation (Signed)
Anesthesia Post Note  Patient: Dawn Walton  Procedure(s) Performed: LAPAROSCOPY OPERATIVE; ABLATION OF ENDOMETRIAL IMPLANTS; STRIPPING OF PERITONEUM (N/A )  Patient location during evaluation: PACU Anesthesia Type: General Level of consciousness: awake and alert and oriented Pain management: pain level controlled Vital Signs Assessment: post-procedure vital signs reviewed and stable Respiratory status: spontaneous breathing Cardiovascular status: blood pressure returned to baseline Anesthetic complications: no     Last Vitals:  Vitals:   08/27/17 1536 08/27/17 1558  BP: 130/89 108/71  Pulse: 95 85  Resp: (!) 23 16  Temp:  36.6 C  SpO2: 94% 93%    Last Pain:  Vitals:   08/27/17 1558  TempSrc: Temporal  PainSc: 5                  Valentino Saavedra

## 2017-08-29 ENCOUNTER — Telehealth: Payer: Self-pay | Admitting: Obstetrics & Gynecology

## 2017-08-29 ENCOUNTER — Encounter: Payer: Self-pay | Admitting: Obstetrics & Gynecology

## 2017-08-29 NOTE — Telephone Encounter (Addendum)
Called this afternoon to see how she was doing postop  Doing ok, no real concerns, right incision hurts more than left.    Has not had a BM in several days.  No fever, nausea. Otherwise doing good.  Will let me know of any concerns  cw

## 2017-09-02 LAB — SURGICAL PATHOLOGY

## 2017-10-20 ENCOUNTER — Encounter (HOSPITAL_COMMUNITY): Payer: Self-pay

## 2017-10-20 ENCOUNTER — Other Ambulatory Visit: Payer: Self-pay

## 2017-10-20 ENCOUNTER — Emergency Department (HOSPITAL_COMMUNITY)
Admission: EM | Admit: 2017-10-20 | Discharge: 2017-10-21 | Disposition: A | Discharge status: Home / Self Care | Payer: Medicaid Other | Attending: Emergency Medicine | Admitting: Emergency Medicine

## 2017-10-20 DIAGNOSIS — Z9889 Other specified postprocedural states: Secondary | ICD-10-CM | POA: Insufficient documentation

## 2017-10-20 DIAGNOSIS — R109 Unspecified abdominal pain: Secondary | ICD-10-CM | POA: Insufficient documentation

## 2017-10-20 DIAGNOSIS — Z5321 Procedure and treatment not carried out due to patient leaving prior to being seen by health care provider: Secondary | ICD-10-CM | POA: Insufficient documentation

## 2017-10-20 DIAGNOSIS — R110 Nausea: Secondary | ICD-10-CM | POA: Insufficient documentation

## 2017-10-20 DIAGNOSIS — N939 Abnormal uterine and vaginal bleeding, unspecified: Secondary | ICD-10-CM | POA: Insufficient documentation

## 2017-10-20 LAB — COMPREHENSIVE METABOLIC PANEL
ALBUMIN: 3.8 g/dL (ref 3.5–5.0)
ALT: 12 U/L (ref 0–44)
ANION GAP: 8 (ref 5–15)
AST: 15 U/L (ref 15–41)
Alkaline Phosphatase: 68 U/L (ref 38–126)
BUN: 10 mg/dL (ref 6–20)
CALCIUM: 8.9 mg/dL (ref 8.9–10.3)
CHLORIDE: 109 mmol/L (ref 98–111)
CO2: 26 mmol/L (ref 22–32)
Creatinine, Ser: 0.7 mg/dL (ref 0.44–1.00)
GFR calc Af Amer: >60 mL/min (ref >60)
GLUCOSE: 94 mg/dL (ref 70–99)
POTASSIUM: 3.5 mmol/L (ref 3.5–5.1)
Sodium: 143 mmol/L (ref 135–145)
TOTAL PROTEIN: 7.1 g/dL (ref 6.5–8.1)
Total Bilirubin: 0.4 mg/dL (ref 0.3–1.2)

## 2017-10-20 LAB — URINALYSIS, ROUTINE W REFLEX MICROSCOPIC
BACTERIA UA: NONE SEEN
Bilirubin Urine: NEGATIVE
GLUCOSE, UA: NEGATIVE mg/dL
HGB URINE DIPSTICK: NEGATIVE
KETONES UR: NEGATIVE mg/dL
NITRITE: NEGATIVE
PH: 6 (ref 5.0–8.0)
PROTEIN: NEGATIVE mg/dL
Specific Gravity, Urine: 1.027 (ref 1.005–1.030)

## 2017-10-20 LAB — CBC
HCT: 36.2 % (ref 36.0–46.0)
HEMOGLOBIN: 11.9 g/dL — AB (ref 12.0–15.0)
MCH: 28.3 pg (ref 26.0–34.0)
MCHC: 32.9 g/dL (ref 30.0–36.0)
MCV: 86 fL (ref 78.0–100.0)
Platelets: 596 10*3/uL — ABNORMAL HIGH (ref 150–400)
RBC: 4.21 MIL/uL (ref 3.87–5.11)
RDW: 13.8 % (ref 11.5–15.5)
WBC: 4.9 10*3/uL (ref 4.0–10.5)

## 2017-10-20 LAB — LIPASE, BLOOD: LIPASE: 40 U/L (ref 11–51)

## 2017-10-20 LAB — I-STAT BETA HCG BLOOD, ED (MC, WL, AP ONLY): I-stat hCG, quantitative: <5 m[IU]/mL (ref <5)

## 2017-10-20 NOTE — ED Triage Notes (Signed)
Pt presents to ED from home for abdominal pain. Pt reports that she had procedure for endometriosis about a month ago, and is having ABD pain, nausea, and "spotting."

## 2018-01-05 ENCOUNTER — Other Ambulatory Visit: Payer: Self-pay

## 2018-01-05 ENCOUNTER — Encounter: Payer: Self-pay | Admitting: Emergency Medicine

## 2018-01-05 ENCOUNTER — Emergency Department
Admission: EM | Admit: 2018-01-05 | Discharge: 2018-01-06 | Disposition: A | Discharge status: Home / Self Care | Payer: Self-pay | Attending: Emergency Medicine | Admitting: Emergency Medicine

## 2018-01-05 DIAGNOSIS — R1011 Right upper quadrant pain: Secondary | ICD-10-CM | POA: Insufficient documentation

## 2018-01-05 DIAGNOSIS — R102 Pelvic and perineal pain: Secondary | ICD-10-CM

## 2018-01-05 DIAGNOSIS — J45909 Unspecified asthma, uncomplicated: Secondary | ICD-10-CM | POA: Insufficient documentation

## 2018-01-05 DIAGNOSIS — F419 Anxiety disorder, unspecified: Secondary | ICD-10-CM | POA: Insufficient documentation

## 2018-01-05 DIAGNOSIS — N809 Endometriosis, unspecified: Secondary | ICD-10-CM | POA: Insufficient documentation

## 2018-01-05 DIAGNOSIS — Z91040 Latex allergy status: Secondary | ICD-10-CM | POA: Insufficient documentation

## 2018-01-05 LAB — COMPREHENSIVE METABOLIC PANEL
ALT: 15 U/L (ref 0–44)
AST: 17 U/L (ref 15–41)
Albumin: 3.4 g/dL — ABNORMAL LOW (ref 3.5–5.0)
Alkaline Phosphatase: 48 U/L (ref 38–126)
Anion gap: 6 (ref 5–15)
BUN: 9 mg/dL (ref 6–20)
CHLORIDE: 109 mmol/L (ref 98–111)
CO2: 26 mmol/L (ref 22–32)
CREATININE: 0.81 mg/dL (ref 0.44–1.00)
Calcium: 8.5 mg/dL — ABNORMAL LOW (ref 8.9–10.3)
GFR calc Af Amer: >60 mL/min (ref >60)
GFR calc non Af Amer: >60 mL/min (ref >60)
Glucose, Bld: 100 mg/dL — ABNORMAL HIGH (ref 70–99)
POTASSIUM: 3.5 mmol/L (ref 3.5–5.1)
SODIUM: 141 mmol/L (ref 135–145)
Total Bilirubin: 0.4 mg/dL (ref 0.3–1.2)
Total Protein: 5.5 g/dL — ABNORMAL LOW (ref 6.5–8.1)

## 2018-01-05 LAB — URINALYSIS, COMPLETE (UACMP) WITH MICROSCOPIC
BILIRUBIN URINE: NEGATIVE
Glucose, UA: NEGATIVE mg/dL
Hgb urine dipstick: NEGATIVE
KETONES UR: NEGATIVE mg/dL
Nitrite: NEGATIVE
PH: 5 (ref 5.0–8.0)
Protein, ur: 30 mg/dL — AB
Specific Gravity, Urine: 1.031 — ABNORMAL HIGH (ref 1.005–1.030)

## 2018-01-05 LAB — CBC
HEMATOCRIT: 39.2 % (ref 35.0–47.0)
Hemoglobin: 13.3 g/dL (ref 12.0–16.0)
MCH: 29.6 pg (ref 26.0–34.0)
MCHC: 33.9 g/dL (ref 32.0–36.0)
MCV: 87.4 fL (ref 80.0–100.0)
Platelets: 589 10*3/uL — ABNORMAL HIGH (ref 150–440)
RBC: 4.49 MIL/uL (ref 3.80–5.20)
RDW: 14.4 % (ref 11.5–14.5)
WBC: 5.7 10*3/uL (ref 3.6–11.0)

## 2018-01-05 LAB — POCT PREGNANCY, URINE: PREG TEST UR: NEGATIVE

## 2018-01-05 LAB — LIPASE, BLOOD: Lipase: 39 U/L (ref 11–51)

## 2018-01-05 MED ORDER — OXYCODONE-ACETAMINOPHEN 5-325 MG PO TABS
1.0000 | ORAL_TABLET | Freq: Once | ORAL | Status: AC
  Administered 2018-01-05: 1 via ORAL
  Filled 2018-01-05: qty 1

## 2018-01-05 MED ORDER — ONDANSETRON 4 MG PO TBDP
4.0000 mg | ORAL_TABLET | Freq: Once | ORAL | Status: AC
Start: 2018-01-05 — End: 2018-01-05
  Administered 2018-01-05: 4 mg via ORAL
  Filled 2018-01-05: qty 1

## 2018-01-05 NOTE — ED Triage Notes (Signed)
Pt presents to ED with lower abd pain. Pt states she has been having ongoing discomfort since her laparoscopy  in May to remove endometriosis. worsening burning type pain worse today. Painful urination with frequency.

## 2018-01-05 NOTE — ED Notes (Signed)
Pt has low abd pain and dysuria.  Pt states she recently dx with endometriosis.   Pt also reports low back pain.  No n/v/d  No vag bleeding  Denies vag discharge.  Pt alert.

## 2018-01-05 NOTE — ED Provider Notes (Signed)
[facility] Emergency Department Provider Note    First MD Initiated Contact with Patient 01/05/18 2308     (approximate)  I have reviewed the triage vital signs and the nursing notes.   HISTORY  Chief Complaint Abdominal Pain    HPI Dawn Walton is a 22 y.o. female with below list of chronic medical conditions occluding endometriosis status post laparoscopic in May 2019 performed by Dr. Leonides Schanz presents to the emergency department with 6 out of 10 pelvic discomfort.  Patient states that she is at ongoing pelvic discomfort since laparoscopic as well as dysuria.  Patient denies any fever.  Patient does admit to nausea however no vomiting.  Patient states that pain is improved with ibuprofen at home last ibuprofen dose was approximately 1 PM this afternoon.  Patient denies any vaginal discharge.  Past Medical History:  Diagnosis Date  . Allergy   . Anxiety   . Asthma    unclear per pt  . GERD (gastroesophageal reflux disease)   . Headache    MIGRAINES OCCASIONALLY  . Obesity   . UTI (urinary tract infection)     Patient Active Problem List   Diagnosis Date Noted  . URI (upper respiratory infection) 05/02/2014  . Candida vaginitis 05/02/2014  . Suprapubic pain 03/21/2014  . Leukopenia 07/03/2013  . Obesity 05/04/2013  . Contraception 05/04/2013  . Menorrhagia 04/14/2013    Past Surgical History:  Procedure Laterality Date  . LAPAROSCOPY N/A 08/27/2017   Procedure: LAPAROSCOPY OPERATIVE; ABLATION OF ENDOMETRIAL IMPLANTS; STRIPPING OF PERITONEUM;  Surgeon: Ward, Honor Loh, MD;  Location: [facility] ORS;  Service: Gynecology;  Laterality: N/A;  . NO PAST SURGERIES      Prior to Admission medications   Medication Sig Start Date End Date Taking? Authorizing Provider  albuterol (PROVENTIL HFA;VENTOLIN HFA) 108 (90 BASE) MCG/ACT inhaler Inhale into the lungs every 6 (six) hours as needed for wheezing or shortness of breath.    [provider]    oxyCODONE-acetaminophen (PERCOCET) 5-325 MG tablet Take 1 tablet by mouth every 4 (four) hours as needed for severe pain. 01/06/18 01/06/19  Gregor Hams, MD    Allergies Apple; Carrot [daucus carota]; Celery oil; Tomato; and Latex  Family History  Problem Relation Age of Onset  . Hyperlipidemia Maternal Grandmother   . Hypertension Maternal Grandmother   . Diabetes Maternal Grandfather   . Heart disease Maternal Grandfather   . Stroke Maternal Grandfather     Social History Social History   Tobacco Use  . Smoking status: Never Smoker  . Smokeless tobacco: Never Used  Substance Use Topics  . Alcohol use: No    Alcohol/week: 0.0 standard drinks  . Drug use: No    Review of Systems Constitutional: No fever/chills Eyes: No visual changes. ENT: No sore throat. Cardiovascular: Denies chest pain. Respiratory: Denies shortness of breath. Gastrointestinal: Positive for pelvic pain and nausea.  No diarrhea.  No constipation. Genitourinary: Negative for dysuria. Musculoskeletal: Negative for neck pain.  Negative for back pain. Integumentary: Negative for rash. Neurological: Negative for headaches, focal weakness or numbness.   ____________________________________________   PHYSICAL EXAM:  VITAL SIGNS: ED Triage Vitals  Enc Vitals Group     BP 01/05/18 2030 102/77     Pulse Rate 01/05/18 2030 90     Resp 01/05/18 2030 18     Temp 01/05/18 2030 99.4 F (37.4 C)     Temp Source 01/05/18 2030 Oral     SpO2 01/05/18 2030 100 %  Weight 01/05/18 2030 117.9 kg (260 lb)     Height 01/05/18 2030 1.575 m (5\' 2" )     Head Circumference --      Peak Flow --      Pain Score 01/05/18 2043 6     Pain Loc --      Pain Edu? --      Excl. in [place]? --     Constitutional: Alert and oriented. Well appearing and in no acute distress. Eyes: Conjunctivae are normal.  Head: Atraumatic. Mouth/Throat: Mucous membranes are moist.  Oropharynx non-erythematous. Neck: No stridor.    Cardiovascular: Normal rate, regular rhythm. Good peripheral circulation. Grossly normal heart sounds. Respiratory: Normal respiratory effort.  No retractions. Lungs CTAB. Gastrointestinal: Right upper quadrant tenderness to palpation.. No distention.  Musculoskeletal: No lower extremity tenderness nor edema. No gross deformities of extremities. Neurologic:  Normal speech and language. No gross focal neurologic deficits are appreciated.  Skin:  Skin is warm, dry and intact. No rash noted. Psychiatric: Mood and affect are normal. Speech and behavior are normal.  ____________________________________________   LABS (all labs ordered are listed, but only abnormal results are displayed)  Labs Reviewed  COMPREHENSIVE METABOLIC PANEL - Abnormal; Notable for the following components:      Result Value   Glucose, Bld 100 (*)    Calcium 8.5 (*)    Total Protein 5.5 (*)    Albumin 3.4 (*)    All other components within normal limits  CBC - Abnormal; Notable for the following components:   Platelets 589 (*)    All other components within normal limits  URINALYSIS, COMPLETE (UACMP) WITH MICROSCOPIC - Abnormal; Notable for the following components:   Color, Urine YELLOW (*)    APPearance CLOUDY (*)    Specific Gravity, Urine 1.031 (*)    Protein, ur 30 (*)    Leukocytes, UA LARGE (*)    Bacteria, UA RARE (*)    All other components within normal limits  URINALYSIS, COMPLETE (UACMP) WITH MICROSCOPIC - Abnormal; Notable for the following components:   Color, Urine YELLOW (*)    APPearance CLOUDY (*)    Leukocytes, UA MODERATE (*)    All other components within normal limits  LIPASE, BLOOD  POC URINE PREG, ED  POCT PREGNANCY, URINE    RADIOLOGY I, Lockridge N Patrici Minnis, personally viewed and evaluated these images (plain radiographs) as part of my medical decision making, as well as reviewing the written report by the radiologist.  ED MD interpretation: Negative pelvic and right upper  quadrant ultrasound  Official radiology report(s): US Pelvis Transvanginal Non-ob (tv Only)  Result Date: 01/06/2018 CLINICAL DATA:  Bilateral pelvic pain, endometriosis EXAM: TRANSABDOMINAL AND TRANSVAGINAL ULTRASOUND OF PELVIS TECHNIQUE: Both transabdominal and transvaginal ultrasound examinations of the pelvis were performed. Transabdominal technique was performed for global imaging of the pelvis including uterus, ovaries, adnexal regions, and pelvic cul-de-sac. It was necessary to proceed with endovaginal exam following the transabdominal exam to visualize the endometrium. COMPARISON:  08/05/2017 FINDINGS: Uterus Measurements: 7.0 x 2.9 x 4.3 cm. No fibroids or other mass visualized. Endometrium Thickness: 10 mm.  No focal abnormality visualized. Right ovary Measurements: 4.1 x 2.1 x 2.1 cm. Normal appearance/no adnexal mass. Left ovary Measurements: 4.9 x 2.4 x 3.3 cm. 2.2 x 1.6 x 1.7 cm involuting corpus luteal cyst. Other findings Small volume pelvic ascites. IMPRESSION: Negative pelvic ultrasound. Electronically Signed   By: Julian Hy M.D.   On: 01/06/2018 02:37   US Pelvis Complete  Result Date: 01/06/2018 CLINICAL DATA:  Bilateral pelvic pain, endometriosis EXAM: TRANSABDOMINAL AND TRANSVAGINAL ULTRASOUND OF PELVIS TECHNIQUE: Both transabdominal and transvaginal ultrasound examinations of the pelvis were performed. Transabdominal technique was performed for global imaging of the pelvis including uterus, ovaries, adnexal regions, and pelvic cul-de-sac. It was necessary to proceed with endovaginal exam following the transabdominal exam to visualize the endometrium. COMPARISON:  08/05/2017 FINDINGS: Uterus Measurements: 7.0 x 2.9 x 4.3 cm. No fibroids or other mass visualized. Endometrium Thickness: 10 mm.  No focal abnormality visualized. Right ovary Measurements: 4.1 x 2.1 x 2.1 cm. Normal appearance/no adnexal mass. Left ovary Measurements: 4.9 x 2.4 x 3.3 cm. 2.2 x 1.6 x 1.7 cm involuting  corpus luteal cyst. Other findings Small volume pelvic ascites. IMPRESSION: Negative pelvic ultrasound. Electronically Signed   By: Julian Hy M.D.   On: 01/06/2018 02:37   US Abdomen Limited Ruq  Result Date: 01/06/2018 CLINICAL DATA:  Right upper quadrant abdominal pain EXAM: ULTRASOUND ABDOMEN LIMITED RIGHT UPPER QUADRANT COMPARISON:  None. FINDINGS: Gallbladder: No gallstones, gallbladder wall thickening, or pericholecystic fluid. Negative sonographic Murphy's sign. Common bile duct: Diameter: 3 mm Liver: Hyperechoic hepatic parenchyma. No focal hepatic lesion is seen. Portal vein is patent on color Doppler imaging with normal direction of blood flow towards the liver. IMPRESSION: Possible hepatic steatosis. Electronically Signed   By: Julian Hy M.D.   On: 01/06/2018 02:38    ____________________________________________   PROCEDURES  Critical Care performed:   Procedures   ____________________________________________   INITIAL IMPRESSION / ASSESSMENT AND PLAN / ED COURSE  As part of my medical decision making, I reviewed the following data within the electronic MEDICAL RECORD NUMBER   22 year old presenting with above-stated history and physical exam with suspicion of pain secondary to endometriosis however given right upper quadrant pain right upper quadrant ultrasound was performed which was negative.  Pelvic ultrasound likewise negative.  Patient prescribed Percocet for home and advised to take ibuprofen as she did before and if pain not relieved to go ahead and take a Percocet.  Patient advised of the side effects of Percocet.  She was referred to GYN ____________________________________________  FINAL CLINICAL IMPRESSION(S) / ED DIAGNOSES  Final diagnoses:  RUQ pain  Endometriosis     MEDICATIONS GIVEN DURING THIS VISIT:  Medications  oxyCODONE-acetaminophen (PERCOCET/ROXICET) 5-325 MG per tablet 1 tablet (1 tablet Oral Given 01/05/18 2344)  ondansetron  (ZOFRAN-ODT) disintegrating tablet 4 mg (4 mg Oral Given 01/05/18 2343)     ED Discharge Orders         Ordered    oxyCODONE-acetaminophen (PERCOCET) 5-325 MG tablet  Every 4 hours PRN     01/06/18 0258           Note:  This document was prepared using Dragon voice recognition software and may include unintentional dictation errors.    Gregor Hams, MD 01/06/18 [phone]

## 2018-01-06 ENCOUNTER — Emergency Department: Payer: Self-pay

## 2018-01-06 LAB — URINALYSIS, COMPLETE (UACMP) WITH MICROSCOPIC
Bacteria, UA: NONE SEEN
Bilirubin Urine: NEGATIVE
GLUCOSE, UA: NEGATIVE mg/dL
Hgb urine dipstick: NEGATIVE
Ketones, ur: NEGATIVE mg/dL
Nitrite: NEGATIVE
PH: 5 (ref 5.0–8.0)
Protein, ur: NEGATIVE mg/dL
Specific Gravity, Urine: 1.026 (ref 1.005–1.030)

## 2018-01-06 MED ORDER — OXYCODONE-ACETAMINOPHEN 5-325 MG PO TABS: 1.0000 | ORAL_TABLET | ORAL | 0 refills | Status: AC | PRN

## 2018-01-06 NOTE — ED Notes (Signed)
Report from amy, rn.

## 2018-01-06 NOTE — ED Notes (Signed)
Report off to April rn  

## 2018-01-06 NOTE — ED Notes (Signed)
Pt return from u/s  Pt watching tv with family   Pt alert.

## 2018-01-06 NOTE — ED Notes (Signed)
Patient transported to Ultrasound 

## 2018-04-19 ENCOUNTER — Encounter: Payer: Self-pay | Admitting: Emergency Medicine

## 2018-04-19 ENCOUNTER — Emergency Department
Admission: EM | Admit: 2018-04-19 | Discharge: 2018-04-19 | Disposition: A | Discharge status: Home / Self Care | Payer: Medicaid Other | Attending: Emergency Medicine | Admitting: Emergency Medicine

## 2018-04-19 ENCOUNTER — Other Ambulatory Visit: Payer: Self-pay

## 2018-04-19 DIAGNOSIS — R1031 Right lower quadrant pain: Secondary | ICD-10-CM | POA: Insufficient documentation

## 2018-04-19 DIAGNOSIS — J45909 Unspecified asthma, uncomplicated: Secondary | ICD-10-CM | POA: Insufficient documentation

## 2018-04-19 DIAGNOSIS — R1030 Lower abdominal pain, unspecified: Secondary | ICD-10-CM

## 2018-04-19 DIAGNOSIS — Z91040 Latex allergy status: Secondary | ICD-10-CM | POA: Insufficient documentation

## 2018-04-19 DIAGNOSIS — R1032 Left lower quadrant pain: Secondary | ICD-10-CM | POA: Insufficient documentation

## 2018-04-19 LAB — CBC WITH DIFFERENTIAL/PLATELET
Abs Immature Granulocytes: 0.01 10*3/uL (ref 0.00–0.07)
BASOS PCT: 1 %
Basophils Absolute: 0 10*3/uL (ref 0.0–0.1)
Eosinophils Absolute: 0.1 10*3/uL (ref 0.0–0.5)
Eosinophils Relative: 2 %
HCT: 37.8 % (ref 36.0–46.0)
Hemoglobin: 12.3 g/dL (ref 12.0–15.0)
Immature Granulocytes: 0 %
Lymphocytes Relative: 36 %
Lymphs Abs: 2.2 10*3/uL (ref 0.7–4.0)
MCH: 28.5 pg (ref 26.0–34.0)
MCHC: 32.5 g/dL (ref 30.0–36.0)
MCV: 87.5 fL (ref 80.0–100.0)
MONOS PCT: 8 %
Monocytes Absolute: 0.5 10*3/uL (ref 0.1–1.0)
NEUTROS PCT: 53 %
Neutro Abs: 3.2 10*3/uL (ref 1.7–7.7)
Platelets: 596 10*3/uL — ABNORMAL HIGH (ref 150–400)
RBC: 4.32 MIL/uL (ref 3.87–5.11)
RDW: 13 % (ref 11.5–15.5)
WBC: 6 10*3/uL (ref 4.0–10.5)
nRBC: 0 % (ref 0.0–0.2)

## 2018-04-19 LAB — COMPREHENSIVE METABOLIC PANEL
ALT: 14 U/L (ref 0–44)
AST: 16 U/L (ref 15–41)
Albumin: 4.1 g/dL (ref 3.5–5.0)
Alkaline Phosphatase: 73 U/L (ref 38–126)
Anion gap: 7 (ref 5–15)
BUN: 13 mg/dL (ref 6–20)
CO2: 26 mmol/L (ref 22–32)
Calcium: 9.2 mg/dL (ref 8.9–10.3)
Chloride: 106 mmol/L (ref 98–111)
Creatinine, Ser: 0.85 mg/dL (ref 0.44–1.00)
GFR calc Af Amer: >60 mL/min (ref >60)
Glucose, Bld: 95 mg/dL (ref 70–99)
POTASSIUM: 3.9 mmol/L (ref 3.5–5.1)
Sodium: 139 mmol/L (ref 135–145)
Total Bilirubin: 0.3 mg/dL (ref 0.3–1.2)
Total Protein: 7.1 g/dL (ref 6.5–8.1)

## 2018-04-19 LAB — URINALYSIS, COMPLETE (UACMP) WITH MICROSCOPIC
Bilirubin Urine: NEGATIVE
Glucose, UA: NEGATIVE mg/dL
Hgb urine dipstick: NEGATIVE
Ketones, ur: NEGATIVE mg/dL
Leukocytes, UA: NEGATIVE
Nitrite: NEGATIVE
Protein, ur: NEGATIVE mg/dL
SPECIFIC GRAVITY, URINE: 1.016 (ref 1.005–1.030)
pH: 6 (ref 5.0–8.0)

## 2018-04-19 LAB — POCT PREGNANCY, URINE: Preg Test, Ur: NEGATIVE

## 2018-04-19 LAB — LIPASE, BLOOD: LIPASE: 42 U/L (ref 11–51)

## 2018-04-19 MED ORDER — KETOROLAC TROMETHAMINE 60 MG/2ML IM SOLN
60.0000 mg | Freq: Once | INTRAMUSCULAR | Status: AC
  Administered 2018-04-19: 60 mg via INTRAMUSCULAR
  Filled 2018-04-19: qty 2

## 2018-04-19 NOTE — Discharge Instructions (Addendum)
Please seek medical attention for any high fevers, chest pain, shortness of breath, change in behavior, persistent vomiting, bloody stool or any other new or concerning symptoms.  

## 2018-04-19 NOTE — ED Triage Notes (Signed)
Patient ambulatory to triage with steady gait, without difficulty or distress noted; pt reports lower abd pain with spotting since Sunday; st hx endometriosis

## 2018-04-19 NOTE — ED Provider Notes (Signed)
[facility] Emergency Department Provider Note  ____________________________________________   I have reviewed the triage vital signs and the nursing notes.   HISTORY  Chief Complaint Abdominal Pain   History limited by: Not Limited   HPI Dawn Walton is a 23 y.o. female who presents to the emergency department today because of concern for abdominal pain. Patient states the pain is located in the lower abdomen. It is on both sides but slightly worse on the right side. The patient states she has had some issues with chronic abdominal pain since being diagnosed with endometriosis, but that it occasionally becomes worse and that her home ibuprofen will no longer work. The patient has noticed some spotting. She denies any diarrhea or change in stool. No change in urination.   Per medical record review patient has a history of laparoscopic surgery.  Past Medical History:  Diagnosis Date  . Allergy   . Anxiety   . Asthma    unclear per pt  . GERD (gastroesophageal reflux disease)   . Headache    MIGRAINES OCCASIONALLY  . Obesity   . UTI (urinary tract infection)     Patient Active Problem List   Diagnosis Date Noted  . URI (upper respiratory infection) 05/02/2014  . Candida vaginitis 05/02/2014  . Suprapubic pain 03/21/2014  . Leukopenia 07/03/2013  . Obesity 05/04/2013  . Contraception 05/04/2013  . Menorrhagia 04/14/2013    Past Surgical History:  Procedure Laterality Date  . LAPAROSCOPY N/A 08/27/2017   Procedure: LAPAROSCOPY OPERATIVE; ABLATION OF ENDOMETRIAL IMPLANTS; STRIPPING OF PERITONEUM;  Surgeon: Ward, Honor Loh, MD;  Location: [facility] ORS;  Service: Gynecology;  Laterality: N/A;  . NO PAST SURGERIES      Prior to Admission medications   Medication Sig Start Date End Date Taking? Authorizing Provider  albuterol (PROVENTIL HFA;VENTOLIN HFA) 108 (90 BASE) MCG/ACT inhaler Inhale into the lungs every 6 (six) hours as needed for wheezing  or shortness of breath.    [provider]  oxyCODONE-acetaminophen (PERCOCET) 5-325 MG tablet Take 1 tablet by mouth every 4 (four) hours as needed for severe pain. 01/06/18 01/06/19  Gregor Hams, MD    Allergies Apple; Carrot [daucus carota]; Celery oil; Tomato; and Latex  Family History  Problem Relation Age of Onset  . Hyperlipidemia Maternal Grandmother   . Hypertension Maternal Grandmother   . Diabetes Maternal Grandfather   . Heart disease Maternal Grandfather   . Stroke Maternal Grandfather     Social History Social History   Tobacco Use  . Smoking status: Never Smoker  . Smokeless tobacco: Never Used  Substance Use Topics  . Alcohol use: No    Alcohol/week: 0.0 standard drinks  . Drug use: No    Review of Systems Constitutional: No fever/chills Eyes: No visual changes. ENT: No sore throat. Cardiovascular: Denies chest pain. Respiratory: Denies shortness of breath. Gastrointestinal: Positive for abdominal pain.  Genitourinary: Negative for dysuria. Musculoskeletal: Negative for back pain. Skin: Negative for rash. Neurological: Negative for headaches, focal weakness or numbness.  ____________________________________________   PHYSICAL EXAM:  VITAL SIGNS: ED Triage Vitals  Enc Vitals Group     BP 04/19/18 1927 113/69     Pulse Rate 04/19/18 1927 89     Resp 04/19/18 1927 18     Temp 04/19/18 1927 98.8 F (37.1 C)     Temp Source 04/19/18 1927 Oral     SpO2 04/19/18 1927 98 %     Weight 04/19/18 1926 260 lb (117.9 kg)  Height 04/19/18 1926 5\' 2"  (1.575 m)     Head Circumference --      Peak Flow --      Pain Score 04/19/18 1926 7     Pain Loc --      Pain Edu? --      Excl. in [place]? --      Constitutional: Alert and oriented.  Eyes: Conjunctivae are normal.  ENT      Head: Normocephalic and atraumatic.      Nose: No congestion/rhinnorhea.      Mouth/Throat: Mucous membranes are moist.      Neck: No  stridor. Hematological/Lymphatic/Immunilogical: No cervical lymphadenopathy. Cardiovascular: Normal rate, regular rhythm.  No murmurs, rubs, or gallops. Respiratory: Normal respiratory effort without tachypnea nor retractions. Breath sounds are clear and equal bilaterally. No wheezes/rales/rhonchi. Gastrointestinal: Soft and minimally tender in the lower and right side of the abdomen. No rebound. No guarding.  Genitourinary: Deferred Musculoskeletal: Normal range of motion in all extremities. No lower extremity edema. Neurologic:  Normal speech and language. No gross focal neurologic deficits are appreciated.  Skin:  Skin is warm, dry and intact. No rash noted. Psychiatric: Mood and affect are normal. Speech and behavior are normal. Patient exhibits appropriate insight and judgment.  ____________________________________________    LABS (pertinent positives/negatives)  CBC wbc 6.0, hgb 12.3, plt 596 CMP wnl Lipase 42  ____________________________________________   EKG  None  ____________________________________________    RADIOLOGY  None  ____________________________________________   PROCEDURES  Procedures  ____________________________________________   INITIAL IMPRESSION / ASSESSMENT AND PLAN / ED COURSE  Pertinent labs & imaging results that were available during my care of the patient were reviewed by me and considered in my medical decision making (see chart for details).   Patient presented to the emergency department today because of concerns for lower abdominal pain.  Patient does have a history of some chronic pain.  She was given Toradol and did feel better.  Did discuss possibility of ultrasound with patient but given some chronic nature this point do not feel is completely necessary patient felt okay deferring.  Did discuss with patient importance of following up with OB/GYN.  ____________________________________________   FINAL CLINICAL IMPRESSION(S) /  ED DIAGNOSES  Final diagnoses:  Lower abdominal pain     Note: This dictation was prepared with Dragon dictation. Any transcriptional errors that result from this process are unintentional     Nance Pear, MD 04/19/18 2144

## 2018-07-19 ENCOUNTER — Emergency Department (HOSPITAL_COMMUNITY): Payer: Self-pay

## 2018-07-19 ENCOUNTER — Emergency Department (HOSPITAL_COMMUNITY)
Admission: EM | Admit: 2018-07-19 | Discharge: 2018-07-19 | Disposition: A | Discharge status: Home / Self Care | Payer: Self-pay | Attending: Emergency Medicine | Admitting: Emergency Medicine

## 2018-07-19 ENCOUNTER — Other Ambulatory Visit: Payer: Self-pay

## 2018-07-19 ENCOUNTER — Encounter (HOSPITAL_COMMUNITY): Payer: Self-pay | Admitting: Emergency Medicine

## 2018-07-19 DIAGNOSIS — J45909 Unspecified asthma, uncomplicated: Secondary | ICD-10-CM | POA: Insufficient documentation

## 2018-07-19 DIAGNOSIS — Z79899 Other long term (current) drug therapy: Secondary | ICD-10-CM | POA: Insufficient documentation

## 2018-07-19 DIAGNOSIS — R102 Pelvic and perineal pain: Secondary | ICD-10-CM | POA: Insufficient documentation

## 2018-07-19 LAB — CBC WITH DIFFERENTIAL/PLATELET
Abs Immature Granulocytes: 0 10*3/uL (ref 0.00–0.07)
Basophils Absolute: 0 10*3/uL (ref 0.0–0.1)
Basophils Relative: 1 %
Eosinophils Absolute: 0 10*3/uL (ref 0.0–0.5)
Eosinophils Relative: 1 %
HCT: 36.8 % (ref 36.0–46.0)
Hemoglobin: 12 g/dL (ref 12.0–15.0)
Immature Granulocytes: 0 %
Lymphocytes Relative: 53 %
Lymphs Abs: 1.5 10*3/uL (ref 0.7–4.0)
MCH: 28.2 pg (ref 26.0–34.0)
MCHC: 32.6 g/dL (ref 30.0–36.0)
MCV: 86.6 fL (ref 80.0–100.0)
Monocytes Absolute: 0.4 10*3/uL (ref 0.1–1.0)
Monocytes Relative: 14 %
Neutro Abs: 0.9 10*3/uL — ABNORMAL LOW (ref 1.7–7.7)
Neutrophils Relative %: 31 %
Platelets: 436 10*3/uL — ABNORMAL HIGH (ref 150–400)
RBC: 4.25 MIL/uL (ref 3.87–5.11)
RDW: 13.2 % (ref 11.5–15.5)
WBC: 2.9 10*3/uL — ABNORMAL LOW (ref 4.0–10.5)
nRBC: 0 % (ref 0.0–0.2)

## 2018-07-19 LAB — BASIC METABOLIC PANEL
Anion gap: 10 (ref 5–15)
BUN: 8 mg/dL (ref 6–20)
CO2: 23 mmol/L (ref 22–32)
Calcium: 8.8 mg/dL — ABNORMAL LOW (ref 8.9–10.3)
Chloride: 104 mmol/L (ref 98–111)
Creatinine, Ser: 0.83 mg/dL (ref 0.44–1.00)
GFR calc Af Amer: >60 mL/min (ref >60)
GFR calc non Af Amer: >60 mL/min (ref >60)
Glucose, Bld: 110 mg/dL — ABNORMAL HIGH (ref 70–99)
Potassium: 3.3 mmol/L — ABNORMAL LOW (ref 3.5–5.1)
Sodium: 137 mmol/L (ref 135–145)

## 2018-07-19 LAB — URINALYSIS, ROUTINE W REFLEX MICROSCOPIC
Bilirubin Urine: NEGATIVE
Glucose, UA: NEGATIVE mg/dL
Hgb urine dipstick: NEGATIVE
Ketones, ur: 15 mg/dL — AB
Nitrite: NEGATIVE
Protein, ur: 30 mg/dL — AB
Specific Gravity, Urine: >1.03 — ABNORMAL HIGH (ref 1.005–1.030)
pH: 6 (ref 5.0–8.0)

## 2018-07-19 LAB — I-STAT BETA HCG BLOOD, ED (MC, WL, AP ONLY): I-stat hCG, quantitative: <5 m[IU]/mL (ref <5)

## 2018-07-19 LAB — URINALYSIS, MICROSCOPIC (REFLEX)

## 2018-07-19 LAB — PATHOLOGIST SMEAR REVIEW

## 2018-07-19 MED ORDER — SODIUM CHLORIDE 0.9% FLUSH: 3.0000 mL | Freq: Once | INTRAVENOUS | Status: DC

## 2018-07-19 MED ORDER — KETOROLAC TROMETHAMINE 30 MG/ML IJ SOLN
30.0000 mg | Freq: Once | INTRAMUSCULAR | Status: AC
  Administered 2018-07-19: 06:00:00 30 mg via INTRAVENOUS
  Filled 2018-07-19: qty 1

## 2018-07-19 MED ORDER — HYDROCODONE-ACETAMINOPHEN 5-325 MG PO TABS: 1.0000 | ORAL_TABLET | Freq: Four times a day (QID) | ORAL | 0 refills | Status: DC | PRN

## 2018-07-19 MED ORDER — TRAMADOL HCL 50 MG PO TABS
50.0000 mg | ORAL_TABLET | Freq: Once | ORAL | Status: AC
  Administered 2018-07-19: 50 mg via ORAL
  Filled 2018-07-19: qty 1

## 2018-07-19 MED ORDER — NAPROXEN 500 MG PO TABS: 500.0000 mg | ORAL_TABLET | Freq: Two times a day (BID) | ORAL | 0 refills | Status: DC

## 2018-07-19 NOTE — ED Provider Notes (Signed)
[facility] EMERGENCY DEPARTMENT Provider Note   CSN: [id number] Arrival date & time: 07/19/18  0307    History   Chief Complaint Chief Complaint  Patient presents with   Abdominal Pain    HPI Dawn Walton is a 23 y.o. female.     23 year old female with a history of endometriosis presents to the emergency department for 3 days of lower abdominal and pelvic pain.  Symptoms have been fairly constant, nonradiating, waxing and waning in severity.  She has taken 800 mg ibuprofen for pain without relief.  Notes that symptoms feel similar to when she had a flare of her endometriosis.  She has had some vaginal spotting with last menstrual period 2 weeks ago.  Denies nausea, vomiting, diarrhea, fevers, chest pain, shortness of breath, dysuria or hematuria.  Abdominal surgical history significant for laparoscopy. She is sexually active with her husband only and does not express concern for STDs.     Past Medical History:  Diagnosis Date   Allergy    Anxiety    Asthma    unclear per pt   GERD (gastroesophageal reflux disease)    Headache    MIGRAINES OCCASIONALLY   Obesity    UTI (urinary tract infection)     Patient Active Problem List   Diagnosis Date Noted   URI (upper respiratory infection) 05/02/2014   Candida vaginitis 05/02/2014   Suprapubic pain 03/21/2014   Leukopenia 07/03/2013   Obesity 05/04/2013   Contraception 05/04/2013   Menorrhagia 04/14/2013    Past Surgical History:  Procedure Laterality Date   LAPAROSCOPY N/A 08/27/2017   Procedure: LAPAROSCOPY OPERATIVE; ABLATION OF ENDOMETRIAL IMPLANTS; STRIPPING OF PERITONEUM;  Surgeon: Ward, Honor Loh, MD;  Location: [facility] ORS;  Service: Gynecology;  Laterality: N/A;   NO PAST SURGERIES       OB History    Gravida  2   Para      Term      Preterm      AB  2   Living  0     SAB      TAB      Ectopic      Multiple      Live Births               Home  Medications    Prior to Admission medications   Medication Sig Start Date End Date Taking? Authorizing Provider  albuterol (PROVENTIL HFA;VENTOLIN HFA) 108 (90 BASE) MCG/ACT inhaler Inhale into the lungs every 6 (six) hours as needed for wheezing or shortness of breath.   Yes [provider]  HYDROcodone-acetaminophen (NORCO/VICODIN) 5-325 MG tablet Take 1 tablet by mouth every 6 (six) hours as needed for severe pain. 07/19/18   Antonietta Breach, PA-C  naproxen (NAPROSYN) 500 MG tablet Take 1 tablet (500 mg total) by mouth 2 (two) times daily. 07/19/18   Antonietta Breach, PA-C  oxyCODONE-acetaminophen (PERCOCET) 5-325 MG tablet Take 1 tablet by mouth every 4 (four) hours as needed for severe pain. Patient not taking: Reported on 07/19/2018 01/06/18 01/06/19  Gregor Hams, MD    Family History Family History  Problem Relation Age of Onset   Hyperlipidemia Maternal Grandmother    Hypertension Maternal Grandmother    Diabetes Maternal Grandfather    Heart disease Maternal Grandfather    Stroke Maternal Grandfather     Social History Social History   Tobacco Use   Smoking status: Never Smoker   Smokeless tobacco: Never Used  Substance Use Topics  Alcohol use: No    Alcohol/week: 0.0 standard drinks   Drug use: No     Allergies   Apple; Carrot [daucus carota]; Celery oil; Tomato; and Latex   Review of Systems Review of Systems Ten systems reviewed and are negative for acute change, except as noted in the HPI.    Physical Exam Updated Vital Signs BP 110/66 (BP Location: Right Arm)    Pulse 88    Temp 98.2 F (36.8 C) (Oral)    Resp 18    Ht 5' 2.5" (1.588 m)    Wt 122.5 kg    SpO2 98%    BMI 48.60 kg/m   Physical Exam Vitals signs and nursing note reviewed.  Constitutional:      General: She is not in acute distress.    Appearance: She is well-developed. She is not diaphoretic.     Comments: Nontoxic appearing and in NAD  HENT:     Head: Normocephalic and  atraumatic.  Eyes:     General: No scleral icterus.    Conjunctiva/sclera: Conjunctivae normal.  Neck:     Musculoskeletal: Normal range of motion.  Cardiovascular:     Rate and Rhythm: Normal rate and regular rhythm.     Pulses: Normal pulses.  Pulmonary:     Effort: Pulmonary effort is normal. No respiratory distress.     Breath sounds: No stridor.     Comments: Respirations even and unlabored Abdominal:     Comments: Soft, obese abdomen with TTP in the RLQ and LLQ. No peritoneal signs or guarding.  Musculoskeletal: Normal range of motion.  Skin:    General: Skin is warm and dry.     Coloration: Skin is not pale.     Findings: No erythema or rash.  Neurological:     Mental Status: She is alert and oriented to person, place, and time.  Psychiatric:        Behavior: Behavior normal.      ED Treatments / Results  Labs (all labs ordered are listed, but only abnormal results are displayed) Labs Reviewed  URINALYSIS, ROUTINE W REFLEX MICROSCOPIC - Abnormal; Notable for the following components:      Result Value   Specific Gravity, Urine >1.030 (*)    Ketones, ur 15 (*)    Protein, ur 30 (*)    Leukocytes,Ua TRACE (*)    All other components within normal limits  BASIC METABOLIC PANEL - Abnormal; Notable for the following components:   Potassium 3.3 (*)    Glucose, Bld 110 (*)    Calcium 8.8 (*)    All other components within normal limits  CBC WITH DIFFERENTIAL/PLATELET - Abnormal; Notable for the following components:   WBC 2.9 (*)    Platelets 436 (*)    Neutro Abs 0.9 (*)    All other components within normal limits  URINALYSIS, MICROSCOPIC (REFLEX) - Abnormal; Notable for the following components:   Bacteria, UA FEW (*)    All other components within normal limits  I-STAT BETA HCG BLOOD, ED ([facility], [facility], [facility] ONLY)    EKG None  Radiology US Pelvic Complete With Transvaginal  Result Date: 07/19/2018 CLINICAL DATA:  Pelvic pain in female. Personal history of  endometriosis. EXAM: TRANSABDOMINAL ULTRASOUND OF PELVIS DOPPLER ULTRASOUND OF OVARIES TECHNIQUE: Transabdominal ultrasound examination of the pelvis was performed including evaluation of the uterus, ovaries, adnexal regions, and pelvic cul-de-sac. Color and duplex Doppler ultrasound was utilized to evaluate blood flow to the ovaries. COMPARISON:  Pelvic ultrasound  01/06/2018 FINDINGS: Uterus Measurements: 6.9 x 3.3 x 3.4 cm = volume: 41 mL. No fibroids or other mass visualized. Endometrium Thickness: 13.7 cm.  No focal abnormality visualized. Right ovary Measurements: 3.9 x 2.5 x 2.4 cm = volume: 12.3 mL. Normal appearance/no adnexal mass. Multiple follicles are distributed peripherally. Left ovary Measurements: 3.1 x 2.5 x 2.6 cm = volume: 10.9 mL. Normal appearance/no adnexal mass. Multiple follicles are strip you had peripherally. Pulsed Doppler evaluation demonstrates normal low-resistance arterial and venous waveforms in both ovaries. Other: Moderate free fluid is present. IMPRESSION: 1. Normal sonographic appearance of the uterus. 2. Moderate free fluid is likely physiologic. 3. Peripheral distribution of follicles bilaterally is nonspecific, but can be seen in the setting of polycystic ovarian syndrome. Electronically Signed   By: San Morelle M.D.   On: 07/19/2018 04:28    Procedures Procedures (including critical care time)  Medications Ordered in ED Medications  sodium chloride flush (NS) 0.9 % injection 3 mL (has no administration in time range)  ketorolac (TORADOL) 30 MG/ML injection 30 mg (30 mg Intravenous Given 07/19/18 0534)  traMADol (ULTRAM) tablet 50 mg (50 mg Oral Given 07/19/18 3664)     Initial Impression / Assessment and Plan / ED Course  I have reviewed the triage vital signs and the nursing notes.  Pertinent labs & imaging results that were available during my care of the patient were reviewed by me and considered in my medical decision making (see chart for  details).        17:73 AM 23 year old female presents to the emergency department for lower abdominal and pelvic pain x3 days.  Symptoms have been constant.  No associated fevers.  She is afebrile in the ED today.  No present concern for SIRS/Sepsis.  Patient reports intercourse with her husband only; denies concern for STDs.  Noted to have mild tenderness in bilateral lower quadrants.  Will obtain labs as well as pelvic ultrasound.  Patient ordered to receive Toradol for pain management.  Will continue to monitor and reassess.  6:26 AM Patient remains comfortable. Some pain improvement with Toradol. Will give dose of Tramadol for residual pain.  Ultrasound is reassuring. No concern for TOA. Free fluid is nonspecific and favored to be physiologic. Repeat exam with persistent RLQ and LLQ pain. Doubt appendicitis given bilateral tenderness, lack of fever and N/V. UA pending.  She has a leukopenia which has been present on prior evaluations a few years ago.  No significant electrolyte derangements.  Kidney function preserved.  Pregnancy negative ruling out ectopic.  7:17 AM Urinalysis does not suggest UTI.  The patient remains comfortable.  No clinical decompensation.  Given history of endometriosis, persistent discomfort may be related to this.  Have recommended consistent use of NSAIDs.  Plan for discharge on short course of Norco.  Encouraged close follow-up with her OB/GYN.  Return precautions discussed and provided. Patient discharged in stable condition with no unaddressed concerns.   Final Clinical Impressions(s) / ED Diagnoses   Final diagnoses:  Pelvic pain in female    ED Discharge Orders         Ordered    naproxen (NAPROSYN) 500 MG tablet  2 times daily     07/19/18 0716    HYDROcodone-acetaminophen (NORCO/VICODIN) 5-325 MG tablet  Every 6 hours PRN     07/19/18 0716           Antonietta Breach, PA-C 07/21/18 0009    Horton, Barbette Hair, MD 07/25/18 [phone]

## 2018-07-19 NOTE — ED Triage Notes (Signed)
Pt reports lower abdominal pain that is moving into her pelvic area that started on Sunday.  Denies n/v/d, fever, chest pain, sob, or other symptoms.  Reports a headache that comes and goes.

## 2018-07-19 NOTE — Discharge Instructions (Signed)
Your work-up in the emergency department today was reassuring.  We advise close follow-up with your OB/GYN.  Continue use of naproxen daily as prescribed.  You may use Norco as needed for severe pain.  Do not drive or drink alcohol after taking this medication as it may make you drowsy and impair your judgment.  We recommend return to the ED for worsening symptoms such as increased pain, especially with associated fevers, vomiting.  You may also return for other new or concerning symptoms.

## 2018-07-25 ENCOUNTER — Other Ambulatory Visit: Payer: Self-pay

## 2018-07-25 ENCOUNTER — Encounter: Payer: Self-pay | Admitting: Emergency Medicine

## 2018-07-25 ENCOUNTER — Emergency Department
Admission: EM | Admit: 2018-07-25 | Discharge: 2018-07-25 | Disposition: A | Discharge status: Home / Self Care | Payer: Medicaid Other | Attending: Emergency Medicine | Admitting: Emergency Medicine

## 2018-07-25 DIAGNOSIS — Z91040 Latex allergy status: Secondary | ICD-10-CM | POA: Insufficient documentation

## 2018-07-25 DIAGNOSIS — R1030 Lower abdominal pain, unspecified: Secondary | ICD-10-CM

## 2018-07-25 DIAGNOSIS — J45909 Unspecified asthma, uncomplicated: Secondary | ICD-10-CM | POA: Insufficient documentation

## 2018-07-25 LAB — COMPREHENSIVE METABOLIC PANEL
ALT: 27 U/L (ref 0–44)
AST: 25 U/L (ref 15–41)
Albumin: 4 g/dL (ref 3.5–5.0)
Alkaline Phosphatase: 78 U/L (ref 38–126)
Anion gap: 11 (ref 5–15)
BUN: 7 mg/dL (ref 6–20)
CO2: 24 mmol/L (ref 22–32)
Calcium: 8.6 mg/dL — ABNORMAL LOW (ref 8.9–10.3)
Chloride: 102 mmol/L (ref 98–111)
Creatinine, Ser: 0.73 mg/dL (ref 0.44–1.00)
GFR calc Af Amer: >60 mL/min (ref >60)
GFR calc non Af Amer: >60 mL/min (ref >60)
Glucose, Bld: 100 mg/dL — ABNORMAL HIGH (ref 70–99)
Potassium: 3.4 mmol/L — ABNORMAL LOW (ref 3.5–5.1)
Sodium: 137 mmol/L (ref 135–145)
Total Bilirubin: 0.3 mg/dL (ref 0.3–1.2)
Total Protein: 6.9 g/dL (ref 6.5–8.1)

## 2018-07-25 LAB — LIPASE, BLOOD: Lipase: 41 U/L (ref 11–51)

## 2018-07-25 LAB — CBC
HCT: 36.6 % (ref 36.0–46.0)
Hemoglobin: 11.7 g/dL — ABNORMAL LOW (ref 12.0–15.0)
MCH: 27.4 pg (ref 26.0–34.0)
MCHC: 32 g/dL (ref 30.0–36.0)
MCV: 85.7 fL (ref 80.0–100.0)
Platelets: 398 10*3/uL (ref 150–400)
RBC: 4.27 MIL/uL (ref 3.87–5.11)
RDW: 13.1 % (ref 11.5–15.5)
WBC: 4.4 10*3/uL (ref 4.0–10.5)
nRBC: 0 % (ref 0.0–0.2)

## 2018-07-25 LAB — URINALYSIS, COMPLETE (UACMP) WITH MICROSCOPIC
Bacteria, UA: NONE SEEN
Bilirubin Urine: NEGATIVE
Glucose, UA: NEGATIVE mg/dL
Hgb urine dipstick: NEGATIVE
Ketones, ur: NEGATIVE mg/dL
Nitrite: NEGATIVE
Protein, ur: NEGATIVE mg/dL
Specific Gravity, Urine: 1.017 (ref 1.005–1.030)
pH: 7 (ref 5.0–8.0)

## 2018-07-25 LAB — POCT PREGNANCY, URINE: Preg Test, Ur: NEGATIVE

## 2018-07-25 MED ORDER — KETOROLAC TROMETHAMINE 30 MG/ML IJ SOLN
15.0000 mg | Freq: Once | INTRAMUSCULAR | Status: AC
  Administered 2018-07-25: 15 mg via INTRAVENOUS
  Filled 2018-07-25: qty 1

## 2018-07-25 MED ORDER — MORPHINE SULFATE (PF) 4 MG/ML IV SOLN
4.0000 mg | Freq: Once | INTRAVENOUS | Status: AC
  Administered 2018-07-25: 20:00:00 4 mg via INTRAVENOUS
  Filled 2018-07-25: qty 1

## 2018-07-25 MED ORDER — TRAMADOL HCL 50 MG PO TABS: 50.0000 mg | ORAL_TABLET | Freq: Four times a day (QID) | ORAL | 0 refills | Status: DC | PRN

## 2018-07-25 MED ORDER — ONDANSETRON HCL 4 MG/2ML IJ SOLN
4.0000 mg | Freq: Once | INTRAMUSCULAR | Status: AC
  Administered 2018-07-25: 4 mg via INTRAVENOUS
  Filled 2018-07-25: qty 2

## 2018-07-25 NOTE — ED Provider Notes (Signed)
[facility] Emergency Department Provider Note  Time seen: 8:02 PM  I have reviewed the triage vital signs and the nursing notes.   HISTORY  Chief Complaint Abdominal Pain   HPI Dawn Walton is a 23 y.o. female with a past medical history of anxiety, asthma, endometriosis, presents to the emergency department for lower abdominal pain.  According to the patient she typically will get abdominal pain each month consistent with endometriosis however this month that has been lasting longer than normal states she has been having lower abdominal pain over the past 10 days.  States he went to [facility] 1 week ago was worked up and discharged home with no findings besides possible endometriosis.  Patient states she continued to have abdominal pain so she came to the emergency department tonight.  Patient denies any fever cough or congestion.  Denies any recent travel or sick contacts.  Patient denies any nausea vomiting diarrhea dysuria vaginal bleeding or discharge.  Last menstrual period ended 2 and half weeks ago.   Past Medical History:  Diagnosis Date  . Allergy   . Anxiety   . Asthma    unclear per pt  . GERD (gastroesophageal reflux disease)   . Headache    MIGRAINES OCCASIONALLY  . Obesity   . UTI (urinary tract infection)     Patient Active Problem List   Diagnosis Date Noted  . URI (upper respiratory infection) 05/02/2014  . Candida vaginitis 05/02/2014  . Suprapubic pain 03/21/2014  . Leukopenia 07/03/2013  . Obesity 05/04/2013  . Contraception 05/04/2013  . Menorrhagia 04/14/2013    Past Surgical History:  Procedure Laterality Date  . LAPAROSCOPY N/A 08/27/2017   Procedure: LAPAROSCOPY OPERATIVE; ABLATION OF ENDOMETRIAL IMPLANTS; STRIPPING OF PERITONEUM;  Surgeon: Ward, Honor Loh, MD;  Location: [facility] ORS;  Service: Gynecology;  Laterality: N/A;  . NO PAST SURGERIES      Prior to Admission medications   Medication Sig Start Date  End Date Taking? Authorizing Provider  albuterol (PROVENTIL HFA;VENTOLIN HFA) 108 (90 BASE) MCG/ACT inhaler Inhale into the lungs every 6 (six) hours as needed for wheezing or shortness of breath.    [provider]  HYDROcodone-acetaminophen (NORCO/VICODIN) 5-325 MG tablet Take 1 tablet by mouth every 6 (six) hours as needed for severe pain. 07/19/18   Antonietta Breach, PA-C  naproxen (NAPROSYN) 500 MG tablet Take 1 tablet (500 mg total) by mouth 2 (two) times daily. 07/19/18   Antonietta Breach, PA-C  oxyCODONE-acetaminophen (PERCOCET) 5-325 MG tablet Take 1 tablet by mouth every 4 (four) hours as needed for severe pain. Patient not taking: Reported on 07/19/2018 01/06/18 01/06/19  Gregor Hams, MD    Allergies  Allergen Reactions  . Apple Other (See Comments)    Lip swelling  . Carrot [Daucus Carota] Other (See Comments)    Lip swelling  . Celery Oil Anaphylaxis  . Tomato Hives  . Latex Hives    Family History  Problem Relation Age of Onset  . Hyperlipidemia Maternal Grandmother   . Hypertension Maternal Grandmother   . Diabetes Maternal Grandfather   . Heart disease Maternal Grandfather   . Stroke Maternal Grandfather     Social History Social History   Tobacco Use  . Smoking status: Never Smoker  . Smokeless tobacco: Never Used  Substance Use Topics  . Alcohol use: No    Alcohol/week: 0.0 standard drinks  . Drug use: No    Review of Systems Constitutional: Negative for fever. Cardiovascular: Negative  for chest pain. Respiratory: Negative for shortness of breath. Gastrointestinal: Moderate lower abdominal pain, dull aching pain.  Negative for nausea vomiting or diarrhea. Genitourinary: Negative for urinary compaints Musculoskeletal: Negative for musculoskeletal complaints Skin: Negative for skin complaints  Neurological: Negative for headache All other ROS negative  ____________________________________________   PHYSICAL EXAM:  VITAL SIGNS: ED Triage  Vitals  Enc Vitals Group     BP 07/25/18 1945 139/85     Pulse Rate 07/25/18 1945 99     Resp 07/25/18 1945 17     Temp 07/25/18 1945 98.2 F (36.8 C)     Temp Source 07/25/18 1945 Oral     SpO2 07/25/18 1945 98 %     Weight 07/25/18 1946 270 lb (122.5 kg)     Height 07/25/18 1946 5' 2.5" (1.588 m)     Head Circumference --      Peak Flow --      Pain Score 07/25/18 1946 7     Pain Loc --      Pain Edu? --      Excl. in [place]? --     Constitutional: Alert and oriented. Well appearing and in no distress. Eyes: Normal exam ENT      Head: Normocephalic and atraumatic.      Mouth/Throat: Mucous membranes are moist. Cardiovascular: Normal rate, regular rhythm.  Respiratory: Normal respiratory effort without tachypnea nor retractions. Breath sounds are clear Gastrointestinal: Soft and nontender. No distention.   Musculoskeletal: Nontender with normal range of motion in all extremities.  Neurologic:  Normal speech and language. No gross focal neurologic deficits  Skin:  Skin is warm, dry and intact.  Psychiatric: Mood and affect are normal.   ____________________________________________  INITIAL IMPRESSION / ASSESSMENT AND PLAN / ED COURSE  Pertinent labs & imaging results that were available during my care of the patient were reviewed by me and considered in my medical decision making (see chart for details).   Patient presents emergency department for lower abdominal pain.  Has a history of endometriosis, chronic abdominal pain.  States it has been worse over the past 10 days.  Patient was seen at [facility] 1 week ago, I reviewed the patient's record she had an overall normal work-up including an overall normal pelvic ultrasound.  We will check labs, treat pain in the emergency department.  Differential at this time would include endometriosis, chronic abdominal pain, intra-abdominal pathology such as urinary tract infection or pelvic infection.  Patient's lab work is  reassuring.  We will discharge from the emergency department with PCP follow-up.  We will discharge with a very short course of Ultram for the patient.  I discussed return precautions.  Dawn Walton was evaluated in Emergency Department on 07/25/2018 for the symptoms described in the history of present illness. She was evaluated in the context of the global COVID-19 pandemic, which necessitated consideration that the patient might be at risk for infection with the SARS-CoV-2 virus that causes COVID-19. Institutional protocols and algorithms that pertain to the evaluation of patients at risk for COVID-19 are in a state of rapid change based on information released by regulatory bodies including the CDC and federal and state organizations. These policies and algorithms were followed during the patient's care in the ED.  ____________________________________________   FINAL CLINICAL IMPRESSION(S) / ED DIAGNOSES  Lower abdominal pain   Harvest Dark, MD 07/25/18 2156

## 2018-07-25 NOTE — ED Triage Notes (Signed)
PT arrives with complaints of lower right and left abdominal pain that started "a week and a half ago." Pt reports she was seen and discharged from [facility] but reports no relief of pain from medication prescribed. PT denies nausea, vomiting or diarrhea. Pt denies new complaints.  PT also reports headache for the last week.

## 2018-07-25 NOTE — ED Notes (Signed)
Pt states she has hx of endometriosis but this pain feels different. Has had pain for 1 week, no relief from prescribed oxycodone.

## 2018-09-26 ENCOUNTER — Encounter: Payer: Self-pay | Admitting: Emergency Medicine

## 2018-09-26 ENCOUNTER — Other Ambulatory Visit: Payer: Self-pay

## 2018-09-26 ENCOUNTER — Emergency Department
Admission: EM | Admit: 2018-09-26 | Discharge: 2018-09-26 | Disposition: A | Discharge status: Home / Self Care | Payer: Medicaid Other | Attending: Emergency Medicine | Admitting: Emergency Medicine

## 2018-09-26 ENCOUNTER — Emergency Department: Payer: Medicaid Other

## 2018-09-26 DIAGNOSIS — Z91040 Latex allergy status: Secondary | ICD-10-CM | POA: Insufficient documentation

## 2018-09-26 DIAGNOSIS — X501XXA Overexertion from prolonged static or awkward postures, initial encounter: Secondary | ICD-10-CM | POA: Insufficient documentation

## 2018-09-26 DIAGNOSIS — J45909 Unspecified asthma, uncomplicated: Secondary | ICD-10-CM | POA: Insufficient documentation

## 2018-09-26 DIAGNOSIS — Y93K1 Activity, walking an animal: Secondary | ICD-10-CM | POA: Insufficient documentation

## 2018-09-26 DIAGNOSIS — Y999 Unspecified external cause status: Secondary | ICD-10-CM | POA: Insufficient documentation

## 2018-09-26 DIAGNOSIS — S93401A Sprain of unspecified ligament of right ankle, initial encounter: Secondary | ICD-10-CM | POA: Insufficient documentation

## 2018-09-26 DIAGNOSIS — Y929 Unspecified place or not applicable: Secondary | ICD-10-CM | POA: Insufficient documentation

## 2018-09-26 MED ORDER — MELOXICAM 7.5 MG PO TABS
15.0000 mg | ORAL_TABLET | Freq: Once | ORAL | Status: AC
  Administered 2018-09-26: 15 mg via ORAL
  Filled 2018-09-26: qty 2

## 2018-09-26 MED ORDER — MELOXICAM 15 MG PO TABS: 15.0000 mg | ORAL_TABLET | Freq: Every day | ORAL | 0 refills | Status: DC

## 2018-09-26 NOTE — ED Triage Notes (Signed)
Patient to ER for c/o right sided ankle pain. Patient states she tripped and twisted ankle on last Thursday (4 days ago). States she put ice on ankle and it seemed to get better, but has been swelling more and is particularly more painful in the mornings upon waking.

## 2018-09-26 NOTE — Discharge Instructions (Signed)
Acquire and use an ASO lace up stirrup ankle brace.

## 2018-09-26 NOTE — ED Provider Notes (Signed)
[facility] Emergency Department Provider Note  ____________________________________________  Time seen: Approximately 11:04 PM  I have reviewed the triage vital signs and the nursing notes.   HISTORY  Chief Complaint Ankle Pain    HPI Dawn Walton is a 23 y.o. female who presents emergency department complaining of right lateral ankle pain.  Patient reports that she was walking her dog 4 days ago, stepped over a long, felt her ankle invert.  Patient reports that she is having lateral ankle pain since that time.  She is able to put weight on it but doing so increases her pain.  She endorses mild edema to the lateral aspect of the ankle as well.  No medications prior to arrival.  No other complaints.  No history of previous injury to the ankle.  Patient has a history of anxiety, asthma, GERD, migraines.  No complaints of chronic medical problems.         Past Medical History:  Diagnosis Date  . Allergy   . Anxiety   . Asthma    unclear per pt  . GERD (gastroesophageal reflux disease)   . Headache    MIGRAINES OCCASIONALLY  . Obesity   . UTI (urinary tract infection)     Patient Active Problem List   Diagnosis Date Noted  . URI (upper respiratory infection) 05/02/2014  . Candida vaginitis 05/02/2014  . Suprapubic pain 03/21/2014  . Leukopenia 07/03/2013  . Obesity 05/04/2013  . Contraception 05/04/2013  . Menorrhagia 04/14/2013    Past Surgical History:  Procedure Laterality Date  . LAPAROSCOPY N/A 08/27/2017   Procedure: LAPAROSCOPY OPERATIVE; ABLATION OF ENDOMETRIAL IMPLANTS; STRIPPING OF PERITONEUM;  Surgeon: Ward, Honor Loh, MD;  Location: [facility] ORS;  Service: Gynecology;  Laterality: N/A;  . NO PAST SURGERIES      Prior to Admission medications   Medication Sig Start Date End Date Taking? Authorizing Provider  albuterol (PROVENTIL HFA;VENTOLIN HFA) 108 (90 BASE) MCG/ACT inhaler Inhale into the lungs every 6 (six) hours as needed for  wheezing or shortness of breath.    [provider]  HYDROcodone-acetaminophen (NORCO/VICODIN) 5-325 MG tablet Take 1 tablet by mouth every 6 (six) hours as needed for severe pain. 07/19/18   Antonietta Breach, PA-C  meloxicam (MOBIC) 15 MG tablet Take 1 tablet (15 mg total) by mouth daily. 09/26/18   Cuthriell, Charline Bills, PA-C  naproxen (NAPROSYN) 500 MG tablet Take 1 tablet (500 mg total) by mouth 2 (two) times daily. 07/19/18   Antonietta Breach, PA-C  oxyCODONE-acetaminophen (PERCOCET) 5-325 MG tablet Take 1 tablet by mouth every 4 (four) hours as needed for severe pain. Patient not taking: Reported on 07/19/2018 01/06/18 01/06/19  Gregor Hams, MD  traMADol (ULTRAM) 50 MG tablet Take 1 tablet (50 mg total) by mouth every 6 (six) hours as needed. 07/25/18   Harvest Dark, MD    Allergies Apple, Carrot [daucus carota], Celery oil, Tomato, and Latex  Family History  Problem Relation Age of Onset  . Hyperlipidemia Maternal Grandmother   . Hypertension Maternal Grandmother   . Diabetes Maternal Grandfather   . Heart disease Maternal Grandfather   . Stroke Maternal Grandfather     Social History Social History   Tobacco Use  . Smoking status: Never Smoker  . Smokeless tobacco: Never Used  Substance Use Topics  . Alcohol use: No    Alcohol/week: 0.0 standard drinks  . Drug use: No     Review of Systems  Constitutional: No fever/chills Eyes: No visual  changes.  Cardiovascular: no chest pain. Respiratory: no cough. No SOB. Gastrointestinal: No abdominal pain.  No nausea, no vomiting.   Musculoskeletal: Positive for right ankle pain/injury Skin: Negative for rash, abrasions, lacerations, ecchymosis. Neurological: Negative for headaches, focal weakness or numbness. 10-point ROS otherwise negative.  ____________________________________________   PHYSICAL EXAM:  VITAL SIGNS: ED Triage Vitals [09/26/18 2122]  Enc Vitals Group     BP 128/73     Pulse Rate 84     Resp  18     Temp 99.1 F (37.3 C)     Temp Source Oral     SpO2 97 %     Weight 270 lb (122.5 kg)     Height 5\' 2"  (1.575 m)     Head Circumference      Peak Flow      Pain Score 6     Pain Loc      Pain Edu?      Excl. in [place]?      Constitutional: Alert and oriented. Well appearing and in no acute distress. Eyes: Conjunctivae are normal. PERRL. EOMI. Head: Atraumatic. Neck: No stridor.    Cardiovascular: Normal rate, regular rhythm. Normal S1 and S2.  Good peripheral circulation. Respiratory: Normal respiratory effort without tachypnea or retractions. Lungs CTAB. Good air entry to the bases with no decreased or absent breath sounds. Musculoskeletal: Full range of motion to all extremities. No gross deformities appreciated.  Examination of the right ankle reveals minimal edema to the lateral malleolus.  No gross deformity.  Patient is able to flex, extend the foot appropriately.  Patient is tender to palpation along the anterior joint line as well as over the lateral malleolus.  No palpable abnormalities or deficit.  Full range of motion to the ankle.  Dorsalis pedis pulse intact.  Sensation intact all digits distally. Neurologic:  Normal speech and language. No gross focal neurologic deficits are appreciated.  Skin:  Skin is warm, dry and intact. No rash noted. Psychiatric: Mood and affect are normal. Speech and behavior are normal. Patient exhibits appropriate insight and judgement.   ____________________________________________   LABS (all labs ordered are listed, but only abnormal results are displayed)  Labs Reviewed - No data to display ____________________________________________  EKG   ____________________________________________  RADIOLOGY I personally viewed and evaluated these images as part of my medical decision making, as well as reviewing the written report by the radiologist.  Dg Ankle Complete Right  Result Date: 09/26/2018 CLINICAL DATA:  24 year old female  with twisting of the right ankle. EXAM: RIGHT ANKLE - COMPLETE 3+ VIEW COMPARISON:  None. FINDINGS: There is no evidence of fracture, dislocation, or joint effusion. There is no evidence of arthropathy or other focal bone abnormality. Soft tissues are unremarkable. IMPRESSION: Negative. Electronically Signed   By: Anner Crete M.D.   On: 09/26/2018 22:04    ____________________________________________    PROCEDURES  Procedure(s) performed:    Procedures    Medications  meloxicam (MOBIC) tablet 15 mg (has no administration in time range)     ____________________________________________   INITIAL IMPRESSION / ASSESSMENT AND PLAN / ED COURSE  Pertinent labs & imaging results that were available during my care of the patient were reviewed by me and considered in my medical decision making (see chart for details).  Review of the [place] CSRS was performed in accordance of the [place] prior to dispensing any controlled drugs.           Patient's diagnosis is consistent with ankle  sprain.  Patient presented to the emergency department complaining of right ankle pain after injury.  Patient was walking, had an inversion type injury.  Patient is complaining of pain to the lateral malleolus and along the anterior joint line.  X-ray feels no acute osseous abnormality.  Patient is advised to use an ASO lace up stirrup ankle brace at home.  Meloxicam will be prescribed for the patient.  Follow-up with primary care as needed..  Patient is given ED precautions to return to the ED for any worsening or new symptoms.     ____________________________________________  FINAL CLINICAL IMPRESSION(S) / ED DIAGNOSES  Final diagnoses:  Sprain of right ankle, unspecified ligament, initial encounter      NEW MEDICATIONS STARTED DURING THIS VISIT:  ED Discharge Orders         Ordered    meloxicam (MOBIC) 15 MG tablet  Daily     09/26/18 2306              This chart was dictated using  voice recognition software/Dragon. Despite best efforts to proofread, errors can occur which can change the meaning. Any change was purely unintentional.    Darletta Moll, PA-C 09/26/18 2307    Earleen Newport, MD 09/27/18 [phone]

## 2019-05-08 ENCOUNTER — Encounter: Payer: Self-pay | Admitting: Emergency Medicine

## 2019-05-08 ENCOUNTER — Emergency Department
Admission: EM | Admit: 2019-05-08 | Discharge: 2019-05-08 | Disposition: A | Discharge status: Home / Self Care | Payer: Self-pay | Attending: Emergency Medicine | Admitting: Emergency Medicine

## 2019-05-08 ENCOUNTER — Other Ambulatory Visit: Payer: Self-pay

## 2019-05-08 DIAGNOSIS — Z79899 Other long term (current) drug therapy: Secondary | ICD-10-CM | POA: Insufficient documentation

## 2019-05-08 DIAGNOSIS — N3000 Acute cystitis without hematuria: Secondary | ICD-10-CM | POA: Insufficient documentation

## 2019-05-08 DIAGNOSIS — Z91040 Latex allergy status: Secondary | ICD-10-CM | POA: Insufficient documentation

## 2019-05-08 LAB — COMPREHENSIVE METABOLIC PANEL
ALT: 20 U/L (ref 0–44)
AST: 17 U/L (ref 15–41)
Albumin: 3.9 g/dL (ref 3.5–5.0)
Alkaline Phosphatase: 54 U/L (ref 38–126)
Anion gap: 10 (ref 5–15)
BUN: 11 mg/dL (ref 6–20)
CO2: 26 mmol/L (ref 22–32)
Calcium: 9 mg/dL (ref 8.9–10.3)
Chloride: 105 mmol/L (ref 98–111)
Creatinine, Ser: 0.73 mg/dL (ref 0.44–1.00)
GFR calc Af Amer: >60 mL/min (ref >60)
GFR calc non Af Amer: >60 mL/min (ref >60)
Glucose, Bld: 99 mg/dL (ref 70–99)
Potassium: 3.3 mmol/L — ABNORMAL LOW (ref 3.5–5.1)
Sodium: 141 mmol/L (ref 135–145)
Total Bilirubin: 0.6 mg/dL (ref 0.3–1.2)
Total Protein: 6.8 g/dL (ref 6.5–8.1)

## 2019-05-08 LAB — URINALYSIS, COMPLETE (UACMP) WITH MICROSCOPIC
Bacteria, UA: NONE SEEN
Bilirubin Urine: NEGATIVE
Glucose, UA: NEGATIVE mg/dL
Ketones, ur: NEGATIVE mg/dL
Nitrite: NEGATIVE
Protein, ur: 30 mg/dL — AB
Specific Gravity, Urine: 1.027 (ref 1.005–1.030)
pH: 6 (ref 5.0–8.0)

## 2019-05-08 LAB — CBC
HCT: 43.8 % (ref 36.0–46.0)
Hemoglobin: 14 g/dL (ref 12.0–15.0)
MCH: 28.4 pg (ref 26.0–34.0)
MCHC: 32 g/dL (ref 30.0–36.0)
MCV: 88.8 fL (ref 80.0–100.0)
Platelets: 536 10*3/uL — ABNORMAL HIGH (ref 150–400)
RBC: 4.93 MIL/uL (ref 3.87–5.11)
RDW: 13.5 % (ref 11.5–15.5)
WBC: 4.5 10*3/uL (ref 4.0–10.5)
nRBC: 0 % (ref 0.0–0.2)

## 2019-05-08 LAB — PREGNANCY, URINE: Preg Test, Ur: NEGATIVE

## 2019-05-08 LAB — LIPASE, BLOOD: Lipase: 29 U/L (ref 11–51)

## 2019-05-08 MED ORDER — CEPHALEXIN 500 MG PO CAPS: 500.0000 mg | ORAL_CAPSULE | Freq: Two times a day (BID) | ORAL | 0 refills | Status: AC

## 2019-05-08 MED ORDER — NAPROXEN 500 MG PO TABS: 500.0000 mg | ORAL_TABLET | Freq: Two times a day (BID) | ORAL | 0 refills | Status: DC

## 2019-05-08 NOTE — Discharge Instructions (Signed)
Increase intake of water. Follow up with primary care in 10 days for repeat urinalysis. Return to the ER for symptoms that change or worsen if unable to schedule an appointment.

## 2019-05-08 NOTE — ED Provider Notes (Signed)
[facility] Emergency Department Provider Note ____________________________________________   First MD Initiated Contact with Patient 05/08/19 1151     (approximate)  I have reviewed the triage vital signs and the nursing notes.   HISTORY  Chief Complaint Pelvic Pain  HPI Dawn Walton is a 24 y.o. female presents to the emergency department for treatment and evaluation of pelvic pain for the past couple days.  Pain is seems like it is worse on the right side.  She denies any vaginal bleeding or discharge.  She does have a history of endometriosis.  She has experienced some nausea for the past couple days but denies any fever, dysuria, or back pain.  No alleviating measures attempted prior to arrival.         Past Medical History:  Diagnosis Date  . Allergy   . Anxiety   . Asthma    unclear per pt  . GERD (gastroesophageal reflux disease)   . Headache    MIGRAINES OCCASIONALLY  . Obesity   . UTI (urinary tract infection)     Patient Active Problem List   Diagnosis Date Noted  . URI (upper respiratory infection) 05/02/2014  . Candida vaginitis 05/02/2014  . Suprapubic pain 03/21/2014  . Leukopenia 07/03/2013  . Obesity 05/04/2013  . Contraception 05/04/2013  . Menorrhagia 04/14/2013    Past Surgical History:  Procedure Laterality Date  . LAPAROSCOPY N/A 08/27/2017   Procedure: LAPAROSCOPY OPERATIVE; ABLATION OF ENDOMETRIAL IMPLANTS; STRIPPING OF PERITONEUM;  Surgeon: Ward, Honor Loh, MD;  Location: [facility] ORS;  Service: Gynecology;  Laterality: N/A;  . NO PAST SURGERIES      Prior to Admission medications   Medication Sig Start Date End Date Taking? Authorizing Provider  albuterol (PROVENTIL HFA;VENTOLIN HFA) 108 (90 BASE) MCG/ACT inhaler Inhale into the lungs every 6 (six) hours as needed for wheezing or shortness of breath.    [provider]  cephALEXin (KEFLEX) 500 MG capsule Take 1 capsule (500 mg total) by mouth 2 (two)  times daily for 7 days. 05/08/19 05/15/19  Lizanne Erker, Johnette Abraham B, FNP  naproxen (NAPROSYN) 500 MG tablet Take 1 tablet (500 mg total) by mouth 2 (two) times daily with a meal. 05/08/19   Jerardo Costabile B, FNP    Allergies Apple, Carrot [daucus carota], Celery oil, Tomato, and Latex  Family History  Problem Relation Age of Onset  . Hyperlipidemia Maternal Grandmother   . Hypertension Maternal Grandmother   . Diabetes Maternal Grandfather   . Heart disease Maternal Grandfather   . Stroke Maternal Grandfather     Social History Social History   Tobacco Use  . Smoking status: Never Smoker  . Smokeless tobacco: Never Used  Substance Use Topics  . Alcohol use: No    Alcohol/week: 0.0 standard drinks  . Drug use: No    Review of Systems  Constitutional: No fever/chills Eyes: No visual changes. ENT: No sore throat. Cardiovascular: Denies chest pain. Respiratory: Denies shortness of breath. Gastrointestinal: Positive for lower abdominal pain with nausea.   Genitourinary: Negative for dysuria. Musculoskeletal: Negative for back pain. Skin: Negative for rash. Neurological: Negative for headaches, focal weakness or numbness. ____________________________________________   PHYSICAL EXAM:  VITAL SIGNS: ED Triage Vitals  Enc Vitals Group     BP 05/08/19 1119 134/84     Pulse Rate 05/08/19 1119 83     Resp 05/08/19 1119 16     Temp 05/08/19 1119 98.2 F (36.8 C)     Temp Source 05/08/19 1119 Oral  SpO2 05/08/19 1119 100 %     Weight 05/08/19 1120 270 lb (122.5 kg)     Height 05/08/19 1120 5\' 2"  (1.575 m)     Head Circumference --      Peak Flow --      Pain Score 05/08/19 1119 7     Pain Loc --      Pain Edu? --      Excl. in [place]? --     Constitutional: Alert and oriented. Well appearing and in no acute distress. Eyes: Conjunctivae are normal. PERRL. EOMI. Head: Atraumatic. Nose: No congestion/rhinnorhea. Mouth/Throat: Mucous membranes are moist.  Oropharynx  non-erythematous. Neck: No stridor.   Hematological/Lymphatic/Immunilogical: No cervical lymphadenopathy. Cardiovascular: Normal rate, regular rhythm. Grossly normal heart sounds.  Good peripheral circulation. Respiratory: Normal respiratory effort.  No retractions. Lungs CTAB. Gastrointestinal: Soft and nontender. No distention. No abdominal bruits. No CVA tenderness. Genitourinary:  Musculoskeletal: No lower extremity tenderness nor edema.  No joint effusions. Neurologic:  Normal speech and language. No gross focal neurologic deficits are appreciated. No gait instability. Skin:  Skin is warm, dry and intact. No rash noted. Psychiatric: Mood and affect are normal. Speech and behavior are normal.  ____________________________________________   LABS (all labs ordered are listed, but only abnormal results are displayed)  Labs Reviewed  COMPREHENSIVE METABOLIC PANEL - Abnormal; Notable for the following components:      Result Value   Potassium 3.3 (*)    All other components within normal limits  CBC - Abnormal; Notable for the following components:   Platelets 536 (*)    All other components within normal limits  URINALYSIS, COMPLETE (UACMP) WITH MICROSCOPIC - Abnormal; Notable for the following components:   Color, Urine YELLOW (*)    APPearance HAZY (*)    Hgb urine dipstick MODERATE (*)    Protein, ur 30 (*)    Leukocytes,Ua LARGE (*)    All other components within normal limits  LIPASE, BLOOD  PREGNANCY, URINE  POC URINE PREG, ED   ____________________________________________  EKG  Not indicated ____________________________________________  RADIOLOGY  ED MD interpretation:     Official radiology report(s): No results found.  ____________________________________________   PROCEDURES  Procedure(s) performed (including Critical Care):  Procedures  ____________________________________________   INITIAL IMPRESSION / ASSESSMENT AND PLAN     24 year old  female presenting to the emergency department for treatment and evaluation of abdominal pain as described in the HPI.  Initial plan will be to get some labs and urinalysis including urine pregnancy.  Patient has an Implanon and does not believe she is pregnant.  If all are negative, plan will be to proceed with pelvic ultrasound.  DIFFERENTIAL DIAGNOSIS  Pregnancy, endometriosis, ovarian cyst, acute cystitis  ED COURSE  Urinalysis is concerning for acute cystitis.  Patient will be placed on Keflex and Naprosyn.  She is to follow-up with her primary care provider or gynecology if her symptoms are not improving over the next couple of days.  If symptoms worsen and she is unable to schedule an appointment, she is to return to the emergency department. ____________________________________________   FINAL CLINICAL IMPRESSION(S) / ED DIAGNOSES  Final diagnoses:  Acute cystitis without hematuria     ED Discharge Orders         Ordered    cephALEXin (KEFLEX) 500 MG capsule  2 times daily     05/08/19 1330    naproxen (NAPROSYN) 500 MG tablet  2 times daily with meals     05/08/19 1330  Dawn Walton was evaluated in Emergency Department on 05/08/2019 for the symptoms described in the history of present illness. She was evaluated in the context of the global COVID-19 pandemic, which necessitated consideration that the patient might be at risk for infection with the SARS-CoV-2 virus that causes COVID-19. Institutional protocols and algorithms that pertain to the evaluation of patients at risk for COVID-19 are in a state of rapid change based on information released by regulatory bodies including the CDC and federal and state organizations. These policies and algorithms were followed during the patient's care in the ED.   Note:  This document was prepared using Dragon voice recognition software and may include unintentional dictation errors.   Victorino Dike, FNP 05/08/19  1835    Earleen Newport, MD 05/09/19 [phone]

## 2019-05-08 NOTE — ED Notes (Signed)
Pt c/o lower abdominal w/ a focus in RLQ x 2days. Pt states pain radiates down to top of rt leg/pelvis area. Pt has had nausea x 2days. Pt denies V/D, fever, difficulty using bathroom.

## 2019-05-08 NOTE — ED Triage Notes (Signed)
Pt in via POV, reports generalized pelvic pain x 2 days, worse on the right side.  Pt denies any urinary symptoms, denies any vaginal bleeding or abnormal discharge.  Ambulatory to triage, NAD noted at this time.

## 2019-09-27 ENCOUNTER — Telehealth: Payer: Self-pay | Admitting: General Practice

## 2019-09-27 NOTE — Telephone Encounter (Signed)
Removed Dr. Deborra Medina as patient's PCP due to her leaving this practice and patient has not been seen here in 5 years.

## 2019-10-24 ENCOUNTER — Emergency Department
Admission: EM | Admit: 2019-10-24 | Discharge: 2019-10-24 | Disposition: A | Discharge status: Home / Self Care | Payer: Managed Care, Other (non HMO) | Attending: Emergency Medicine | Admitting: Emergency Medicine

## 2019-10-24 ENCOUNTER — Other Ambulatory Visit: Payer: Self-pay

## 2019-10-24 DIAGNOSIS — R05 Cough: Secondary | ICD-10-CM | POA: Insufficient documentation

## 2019-10-24 DIAGNOSIS — Z91040 Latex allergy status: Secondary | ICD-10-CM | POA: Insufficient documentation

## 2019-10-24 DIAGNOSIS — J069 Acute upper respiratory infection, unspecified: Secondary | ICD-10-CM | POA: Insufficient documentation

## 2019-10-24 DIAGNOSIS — J45909 Unspecified asthma, uncomplicated: Secondary | ICD-10-CM | POA: Insufficient documentation

## 2019-10-24 NOTE — ED Notes (Signed)
Patient reports dry cough for past week. Today she noticed blood mixed with mucus as she was coughing this morning. She denies having a fever, but reports chills; she denies NVD, denies any other body aches or pain, and denies difficulty breathing.

## 2019-10-24 NOTE — ED Provider Notes (Signed)
[facility] Emergency Department Provider Note  ____________________________________________   First MD Initiated Contact with Patient 10/24/19 1251     (approximate)  I have reviewed the triage vital signs and the nursing notes.   HISTORY  Chief Complaint Sore Throat and Cough   HPI Dawn Walton is a 24 y.o. female presents to the ED with complaint of dry cough for the last 9 days.  Patient states that she has had some chills but no fever.  Nasal congestion and sore throat.  Patient denies any change in taste or smell, there is been no vomiting or diarrhea, no exposure to Covid.  Patient reports that she has been using over-the-counter medication for her cough which has been helping and allowing her to sleep at night.  She rates her pain as 6 out of 10.      Past Medical History:  Diagnosis Date  . Allergy   . Anxiety   . Asthma    unclear per pt  . GERD (gastroesophageal reflux disease)   . Headache    MIGRAINES OCCASIONALLY  . Obesity   . UTI (urinary tract infection)     Patient Active Problem List   Diagnosis Date Noted  . URI (upper respiratory infection) 05/02/2014  . Candida vaginitis 05/02/2014  . Suprapubic pain 03/21/2014  . Leukopenia 07/03/2013  . Obesity 05/04/2013  . Contraception 05/04/2013  . Menorrhagia 04/14/2013    Past Surgical History:  Procedure Laterality Date  . LAPAROSCOPY N/A 08/27/2017   Procedure: LAPAROSCOPY OPERATIVE; ABLATION OF ENDOMETRIAL IMPLANTS; STRIPPING OF PERITONEUM;  Surgeon: Ward, Honor Loh, MD;  Location: [facility] ORS;  Service: Gynecology;  Laterality: N/A;  . NO PAST SURGERIES      Prior to Admission medications   Medication Sig Start Date End Date Taking? Authorizing Provider  albuterol (PROVENTIL HFA;VENTOLIN HFA) 108 (90 BASE) MCG/ACT inhaler Inhale into the lungs every 6 (six) hours as needed for wheezing or shortness of breath.    [provider]    Allergies Apple, Carrot  [daucus carota], Celery oil, Tomato, and Latex  Family History  Problem Relation Age of Onset  . Hyperlipidemia Maternal Grandmother   . Hypertension Maternal Grandmother   . Diabetes Maternal Grandfather   . Heart disease Maternal Grandfather   . Stroke Maternal Grandfather     Social History Social History   Tobacco Use  . Smoking status: Never Smoker  . Smokeless tobacco: Never Used  Vaping Use  . Vaping Use: Never used  Substance Use Topics  . Alcohol use: No    Alcohol/week: 0.0 standard drinks  . Drug use: No    Review of Systems Constitutional: No fever/positive chills Eyes: No visual changes. ENT: Positive sore throat. Cardiovascular: Denies chest pain. Respiratory: Denies shortness of breath.  Positive nonproductive cough. Gastrointestinal: No abdominal pain.  No nausea, no vomiting.  No diarrhea.  Genitourinary: Negative for dysuria. Musculoskeletal: Negative for muscle aches. Skin: Negative for rash. Neurological: Negative for headaches, focal weakness or numbness. ____________________________________________   PHYSICAL EXAM:  VITAL SIGNS: ED Triage Vitals  Enc Vitals Group     BP 10/24/19 1133 (!) 121/91     Pulse Rate 10/24/19 1133 85     Resp 10/24/19 1133 18     Temp 10/24/19 1133 98.3 F (36.8 C)     Temp Source 10/24/19 1133 Oral     SpO2 10/24/19 1133 98 %     Weight 10/24/19 1132 275 lb (124.7 kg)  Height 10/24/19 1132 5\' 2"  (1.575 m)     Head Circumference --      Peak Flow --      Pain Score 10/24/19 1137 6     Pain Loc --      Pain Edu? --      Excl. in [place]? --     Constitutional: Alert and oriented. Well appearing and in no acute distress. Eyes: Conjunctivae are normal. PERRL. EOMI.  Head: Atraumatic. Nose: No congestion/rhinnorhea.  EACs and TMs are clear bilaterally. Mouth/Throat: Mucous membranes are moist.  Oropharynx non-erythematous.  Uvula is midline and no exudate was seen. Neck: No stridor.  No cervical  lymphadenopathy. Cardiovascular: Normal rate, regular rhythm. Grossly normal heart sounds.  Good peripheral circulation. Respiratory: Normal respiratory effort.  No retractions. Lungs CTAB. Gastrointestinal: Soft and nontender. No distention.  Musculoskeletal: Moves upper and lower extremities without any difficulty.  Normal gait was noted. Neurologic:  Normal speech and language. No gross focal neurologic deficits are appreciated.  Skin:  Skin is warm, dry and intact. No rash noted. Psychiatric: Mood and affect are normal. Speech and behavior are normal.  ____________________________________________   LABS (all labs ordered are listed, but only abnormal results are displayed)  Labs Reviewed - No data to display   PROCEDURES  Procedure(s) performed (including Critical Care):  Procedures   ____________________________________________   INITIAL IMPRESSION / ASSESSMENT AND PLAN / ED COURSE  As part of my medical decision making, I reviewed the following data within the electronic MEDICAL RECORD NUMBER Notes from prior ED visits and [place] Controlled Substance Database  Angeliah Willcutt was evaluated in Emergency Department on 10/24/2019 for the symptoms described in the history of present illness. She was evaluated in the context of the global COVID-19 pandemic, which necessitated consideration that the patient might be at risk for infection with the SARS-CoV-2 virus that causes COVID-19. Institutional protocols and algorithms that pertain to the evaluation of patients at risk for COVID-19 are in a state of rapid change based on information released by regulatory bodies including the CDC and federal and state organizations. These policies and algorithms were followed during the patient's care in the ED.  24 year old female presents to the ED with complaint of dry cough for the last 9 days.  Patient is a non-smoker without Covid symptoms.  Patient denies any Covid exposure.  Patient denies any  fever but has had some chills.  She states that the urgent counter cough medication she is using is helping.  Physical exam was benign.  Patient states that she is comfortable continuing taking her medication at home.  She does need a work note.  Patient was encouraged to increase fluids and take Tylenol or ibuprofen as needed.   ____________________________________________   FINAL CLINICAL IMPRESSION(S) / ED DIAGNOSES  Final diagnoses:  Viral URI with cough     ED Discharge Orders    None       Note:  This document was prepared using Dragon voice recognition software and may include unintentional dictation errors.    Johnn Hai, PA-C 10/24/19 1412    Earleen Newport, MD 10/24/19 [phone]

## 2019-10-24 NOTE — ED Triage Notes (Signed)
Pt comes via POV from home with c/o cough that is productive and sore throat. Pt denies any belly pain. Pt states this has been going on for couple of days.

## 2019-10-24 NOTE — Discharge Instructions (Signed)
Follow-up with your primary care provider if any continued problems or concerns.  Return to the emergency department for any severe worsening of your symptoms such as difficulty breathing or shortness of breath.  Continue taking your over-the-counter medication as needed for your symptoms.  Increase fluids to stay hydrated and also Tylenol or ibuprofen if needed for fever, body aches or headache.

## 2020-01-07 ENCOUNTER — Encounter: Payer: Self-pay | Admitting: Emergency Medicine

## 2020-01-07 ENCOUNTER — Emergency Department
Admission: EM | Admit: 2020-01-07 | Discharge: 2020-01-07 | Disposition: A | Discharge status: Home / Self Care | Payer: Medicaid Other | Attending: Emergency Medicine | Admitting: Emergency Medicine

## 2020-01-07 ENCOUNTER — Other Ambulatory Visit: Payer: Self-pay

## 2020-01-07 DIAGNOSIS — R1030 Lower abdominal pain, unspecified: Secondary | ICD-10-CM | POA: Insufficient documentation

## 2020-01-07 DIAGNOSIS — Z5321 Procedure and treatment not carried out due to patient leaving prior to being seen by health care provider: Secondary | ICD-10-CM | POA: Insufficient documentation

## 2020-01-07 HISTORY — DX: Endometriosis, unspecified: N80.9

## 2020-01-07 LAB — CBC
HCT: 38.1 % (ref 36.0–46.0)
Hemoglobin: 13.2 g/dL (ref 12.0–15.0)
MCH: 29.7 pg (ref 26.0–34.0)
MCHC: 34.6 g/dL (ref 30.0–36.0)
MCV: 85.8 fL (ref 80.0–100.0)
Platelets: 593 10*3/uL — ABNORMAL HIGH (ref 150–400)
RBC: 4.44 MIL/uL (ref 3.87–5.11)
RDW: 13.3 % (ref 11.5–15.5)
WBC: 6.3 10*3/uL (ref 4.0–10.5)
nRBC: 0 % (ref 0.0–0.2)

## 2020-01-07 LAB — COMPREHENSIVE METABOLIC PANEL
ALT: 18 U/L (ref 0–44)
AST: 15 U/L (ref 15–41)
Albumin: 4.2 g/dL (ref 3.5–5.0)
Alkaline Phosphatase: 66 U/L (ref 38–126)
Anion gap: 9 (ref 5–15)
BUN: 10 mg/dL (ref 6–20)
CO2: 25 mmol/L (ref 22–32)
Calcium: 9 mg/dL (ref 8.9–10.3)
Chloride: 105 mmol/L (ref 98–111)
Creatinine, Ser: 0.73 mg/dL (ref 0.44–1.00)
GFR calc Af Amer: >60 mL/min (ref >60)
GFR calc non Af Amer: >60 mL/min (ref >60)
Glucose, Bld: 103 mg/dL — ABNORMAL HIGH (ref 70–99)
Potassium: 3.6 mmol/L (ref 3.5–5.1)
Sodium: 139 mmol/L (ref 135–145)
Total Bilirubin: 0.5 mg/dL (ref 0.3–1.2)
Total Protein: 7.4 g/dL (ref 6.5–8.1)

## 2020-01-07 LAB — URINALYSIS, COMPLETE (UACMP) WITH MICROSCOPIC
Bacteria, UA: NONE SEEN
Bilirubin Urine: NEGATIVE
Glucose, UA: NEGATIVE mg/dL
Hgb urine dipstick: NEGATIVE
Ketones, ur: 5 mg/dL — AB
Leukocytes,Ua: NEGATIVE
Nitrite: NEGATIVE
Protein, ur: NEGATIVE mg/dL
Specific Gravity, Urine: 1.008 (ref 1.005–1.030)
pH: 6 (ref 5.0–8.0)

## 2020-01-07 LAB — POCT PREGNANCY, URINE: Preg Test, Ur: NEGATIVE

## 2020-01-07 NOTE — ED Notes (Signed)
Patient up to stat desk to report she was leaving. Patient encouraged to stay. Patient reported she had called [facility] and was informed they did not have a wait. Patient again encouraged to stay. Patient left.

## 2020-01-07 NOTE — ED Triage Notes (Signed)
Patient with complaint of lower abdominal pain times one hour. Patient denies nausea/vomiting or any urinary symptoms.

## 2020-01-08 ENCOUNTER — Encounter (HOSPITAL_COMMUNITY): Payer: Self-pay

## 2020-01-08 ENCOUNTER — Emergency Department (HOSPITAL_COMMUNITY): Payer: Self-pay

## 2020-01-08 ENCOUNTER — Other Ambulatory Visit: Payer: Self-pay

## 2020-01-08 ENCOUNTER — Emergency Department (HOSPITAL_COMMUNITY)
Admission: EM | Admit: 2020-01-08 | Discharge: 2020-01-08 | Disposition: A | Discharge status: Home / Self Care | Payer: Self-pay | Attending: Emergency Medicine | Admitting: Emergency Medicine

## 2020-01-08 DIAGNOSIS — N83201 Unspecified ovarian cyst, right side: Secondary | ICD-10-CM

## 2020-01-08 DIAGNOSIS — N83202 Unspecified ovarian cyst, left side: Secondary | ICD-10-CM | POA: Insufficient documentation

## 2020-01-08 DIAGNOSIS — N898 Other specified noninflammatory disorders of vagina: Secondary | ICD-10-CM | POA: Insufficient documentation

## 2020-01-08 DIAGNOSIS — R1031 Right lower quadrant pain: Secondary | ICD-10-CM

## 2020-01-08 DIAGNOSIS — Z91040 Latex allergy status: Secondary | ICD-10-CM | POA: Insufficient documentation

## 2020-01-08 DIAGNOSIS — K219 Gastro-esophageal reflux disease without esophagitis: Secondary | ICD-10-CM | POA: Insufficient documentation

## 2020-01-08 DIAGNOSIS — Z202 Contact with and (suspected) exposure to infections with a predominantly sexual mode of transmission: Secondary | ICD-10-CM | POA: Insufficient documentation

## 2020-01-08 DIAGNOSIS — R109 Unspecified abdominal pain: Secondary | ICD-10-CM | POA: Insufficient documentation

## 2020-01-08 DIAGNOSIS — J45909 Unspecified asthma, uncomplicated: Secondary | ICD-10-CM | POA: Insufficient documentation

## 2020-01-08 LAB — CBC
HCT: 38.7 % (ref 36.0–46.0)
Hemoglobin: 12.7 g/dL (ref 12.0–15.0)
MCH: 29.3 pg (ref 26.0–34.0)
MCHC: 32.8 g/dL (ref 30.0–36.0)
MCV: 89.4 fL (ref 80.0–100.0)
Platelets: 592 10*3/uL — ABNORMAL HIGH (ref 150–400)
RBC: 4.33 MIL/uL (ref 3.87–5.11)
RDW: 13.1 % (ref 11.5–15.5)
WBC: 3.9 10*3/uL — ABNORMAL LOW (ref 4.0–10.5)
nRBC: 0 % (ref 0.0–0.2)

## 2020-01-08 LAB — URINALYSIS, ROUTINE W REFLEX MICROSCOPIC
Bilirubin Urine: NEGATIVE
Glucose, UA: NEGATIVE mg/dL
Hgb urine dipstick: NEGATIVE
Ketones, ur: 5 mg/dL — AB
Leukocytes,Ua: NEGATIVE
Nitrite: NEGATIVE
Protein, ur: NEGATIVE mg/dL
Specific Gravity, Urine: 1.015 (ref 1.005–1.030)
pH: 8 (ref 5.0–8.0)

## 2020-01-08 LAB — COMPREHENSIVE METABOLIC PANEL
ALT: 16 U/L (ref 0–44)
AST: 14 U/L — ABNORMAL LOW (ref 15–41)
Albumin: 3.9 g/dL (ref 3.5–5.0)
Alkaline Phosphatase: 53 U/L (ref 38–126)
Anion gap: 10 (ref 5–15)
BUN: 10 mg/dL (ref 6–20)
CO2: 27 mmol/L (ref 22–32)
Calcium: 9.1 mg/dL (ref 8.9–10.3)
Chloride: 101 mmol/L (ref 98–111)
Creatinine, Ser: 0.77 mg/dL (ref 0.44–1.00)
GFR calc Af Amer: >60 mL/min (ref >60)
GFR calc non Af Amer: >60 mL/min (ref >60)
Glucose, Bld: 89 mg/dL (ref 70–99)
Potassium: 3.6 mmol/L (ref 3.5–5.1)
Sodium: 138 mmol/L (ref 135–145)
Total Bilirubin: 0.6 mg/dL (ref 0.3–1.2)
Total Protein: 7.2 g/dL (ref 6.5–8.1)

## 2020-01-08 LAB — I-STAT BETA HCG BLOOD, ED (MC, WL, AP ONLY): I-stat hCG, quantitative: <5 m[IU]/mL (ref <5)

## 2020-01-08 LAB — WET PREP, GENITAL
Sperm: NONE SEEN
Trich, Wet Prep: NONE SEEN
Yeast Wet Prep HPF POC: NONE SEEN

## 2020-01-08 LAB — LIPASE, BLOOD: Lipase: 37 U/L (ref 11–51)

## 2020-01-08 MED ORDER — IOHEXOL 300 MG/ML  SOLN
100.0000 mL | Freq: Once | INTRAMUSCULAR | Status: AC | PRN
  Administered 2020-01-08: 100 mL via INTRAVENOUS

## 2020-01-08 MED ORDER — MORPHINE SULFATE (PF) 4 MG/ML IV SOLN
4.0000 mg | Freq: Once | INTRAVENOUS | Status: AC
  Administered 2020-01-08: 4 mg via INTRAVENOUS
  Filled 2020-01-08: qty 1

## 2020-01-08 MED ORDER — DOXYCYCLINE HYCLATE 100 MG PO CAPS: 100.0000 mg | ORAL_CAPSULE | Freq: Two times a day (BID) | ORAL | 0 refills | Status: AC

## 2020-01-08 MED ORDER — SODIUM CHLORIDE (PF) 0.9 % IJ SOLN
INTRAMUSCULAR | Status: AC
  Administered 2020-01-08: 10 mL
  Filled 2020-01-08: qty 50

## 2020-01-08 MED ORDER — SODIUM CHLORIDE 0.9 % IV SOLN
1.0000 g | Freq: Once | INTRAVENOUS | Status: AC
  Administered 2020-01-08: 1 g via INTRAVENOUS
  Filled 2020-01-08: qty 10

## 2020-01-08 MED ORDER — ONDANSETRON HCL 4 MG/2ML IJ SOLN
4.0000 mg | Freq: Once | INTRAMUSCULAR | Status: AC
  Administered 2020-01-08: 4 mg via INTRAVENOUS
  Filled 2020-01-08: qty 2

## 2020-01-08 NOTE — ED Notes (Signed)
Ultrasound at bedside

## 2020-01-08 NOTE — ED Provider Notes (Signed)
[facility] DEPT Provider Note   CSN: [id number] Arrival date & time: 01/08/20  1150     History Chief Complaint  Patient presents with  . Abdominal Pain  . Nausea    Dawn Walton is a 24 y.o. female.  Presents to the emergency department with concern for abdominal pain.  Patient reports that since yesterday for Saturday she has been having intermittent dull achy abdominal pain, initially left, then right-sided abdominal pain.  Lower abdominal pain.  No pelvic pain.  She denies any vaginal discharge, does periodically have unprotected sex with men, oral and vaginal.  Denies prior history of STDs.  No fevers, no nausea or vomiting.  Pain is currently moderate, dull achy, crampy.  HPI     Past Medical History:  Diagnosis Date  . Allergy   . Anxiety   . Asthma    unclear per pt  . Endometriosis   . GERD (gastroesophageal reflux disease)   . Headache    MIGRAINES OCCASIONALLY  . Obesity   . UTI (urinary tract infection)     Patient Active Problem List   Diagnosis Date Noted  . URI (upper respiratory infection) 05/02/2014  . Candida vaginitis 05/02/2014  . Suprapubic pain 03/21/2014  . Leukopenia 07/03/2013  . Obesity 05/04/2013  . Contraception 05/04/2013  . Menorrhagia 04/14/2013    Past Surgical History:  Procedure Laterality Date  . LAPAROSCOPY N/A 08/27/2017   Procedure: LAPAROSCOPY OPERATIVE; ABLATION OF ENDOMETRIAL IMPLANTS; STRIPPING OF PERITONEUM;  Surgeon: Ward, Honor Loh, MD;  Location: [facility] ORS;  Service: Gynecology;  Laterality: N/A;  . NO PAST SURGERIES       OB History    Gravida  2   Para      Term      Preterm      AB  2   Living  0     SAB      TAB      Ectopic      Multiple      Live Births              Family History  Problem Relation Age of Onset  . Hyperlipidemia Maternal Grandmother   . Hypertension Maternal Grandmother   . Diabetes Maternal Grandfather   . Heart disease Maternal  Grandfather   . Stroke Maternal Grandfather     Social History   Tobacco Use  . Smoking status: Never Smoker  . Smokeless tobacco: Never Used  Vaping Use  . Vaping Use: Never used  Substance Use Topics  . Alcohol use: Yes    Alcohol/week: 0.0 standard drinks    Comment: occ  . Drug use: No    Home Medications Prior to Admission medications   Medication Sig Start Date End Date Taking? Authorizing Provider  albuterol (PROVENTIL HFA;VENTOLIN HFA) 108 (90 BASE) MCG/ACT inhaler Inhale into the lungs every 6 (six) hours as needed for wheezing or shortness of breath.    [provider]    Allergies    Apple, Carrot [daucus carota], Celery oil, Tomato, and Latex  Review of Systems   Review of Systems  Constitutional: Negative for chills and fever.  HENT: Negative for ear pain and sore throat.   Eyes: Negative for pain and visual disturbance.  Respiratory: Negative for cough and shortness of breath.   Cardiovascular: Negative for chest pain and palpitations.  Gastrointestinal: Positive for abdominal pain. Negative for nausea and vomiting.  Genitourinary: Negative for dysuria and hematuria.  Musculoskeletal: Negative for  arthralgias and back pain.  Skin: Negative for color change and rash.  Neurological: Negative for seizures and syncope.  All other systems reviewed and are negative.   Physical Exam Updated Vital Signs BP 110/72 (BP Location: Right Arm)   Pulse 71   Temp 99.9 F (37.7 C) (Oral)   Resp 16   Ht 5\' 2"  (1.575 m)   Wt 122.5 kg   LMP 12/13/2019 (Approximate)   SpO2 99%   BMI 49.38 kg/m   Physical Exam Vitals and nursing note reviewed.  Constitutional:      General: She is not in acute distress.    Appearance: She is well-developed.  HENT:     Head: Normocephalic and atraumatic.  Eyes:     Conjunctiva/sclera: Conjunctivae normal.  Cardiovascular:     Rate and Rhythm: Normal rate and regular rhythm.     Heart sounds: No murmur heard.    Pulmonary:     Effort: Pulmonary effort is normal. No respiratory distress.     Breath sounds: Normal breath sounds.  Abdominal:     General: Abdomen is flat. Bowel sounds are normal.     Palpations: Abdomen is soft.     Tenderness: There is no abdominal tenderness.  Genitourinary:    Comments: Normal-appearing external genitalia, cervix appears normal, closed os, small white/yellow vaginal discharge, some right and left-sided adnexal tenderness, no masses palpated, no cervical motion tenderness Musculoskeletal:     Cervical back: Neck supple.  Skin:    General: Skin is warm and dry.  Neurological:     Mental Status: She is alert.     ED Results / Procedures / Treatments   Labs (all labs ordered are listed, but only abnormal results are displayed) Labs Reviewed  COMPREHENSIVE METABOLIC PANEL - Abnormal; Notable for the following components:      Result Value   AST 14 (*)    All other components within normal limits  CBC - Abnormal; Notable for the following components:   WBC 3.9 (*)    Platelets 592 (*)    All other components within normal limits  URINALYSIS, ROUTINE W REFLEX MICROSCOPIC - Abnormal; Notable for the following components:   Ketones, ur 5 (*)    All other components within normal limits  LIPASE, BLOOD  I-STAT BETA HCG BLOOD, ED ([facility], [facility], [facility] ONLY)    EKG None  Radiology No results found.  Procedures Procedures (including critical care time)  Medications Ordered in ED Medications  morphine 4 MG/ML injection 4 mg (has no administration in time range)  ondansetron (ZOFRAN) injection 4 mg (has no administration in time range)    ED Course  I have reviewed the triage vital signs and the nursing notes.  Pertinent labs & imaging results that were available during my care of the patient were reviewed by me and considered in my medical decision making (see chart for details).  Clinical Course as of Jan 08 1536  Mon Jan 08, 2020  1518 Abby RN  chaperone for pelvic exam   [RD]    Clinical Course User Index [RD] Lucrezia Starch, MD   MDM Rules/Calculators/A&P                         24 year old lady presents to ER with concern for lower abdominal pain.  On physical exam she is well-appearing, no distress, did note some tenderness in her right lower quadrant, labs yesterday were stable but no imaging performed at outside  ER.  CT abdomen pelvis was negative for acute abdominal pelvic pathology but did demonstrate simple ovarian cyst.  Pelvic exam was notable for yellow/white d/c as well as some b/l adnexal tenderness. Will obtain US to r/o acute gyn process - torsion, toa, etc.  Sent wet prep and gonorrhea/chlamydia swab.  We will treat empirically for GC chlamydia.  While awaiting results of TVUS, signed out to Dr. Tomi Bamberger.  Final Clinical Impression(s) / ED Diagnoses Final diagnoses:  Possible exposure to STD  Vaginal discharge  Abdominal pain, unspecified abdominal location  Cyst of right ovary    Rx / DC Orders ED Discharge Orders    None       Lucrezia Starch, MD 01/08/20 1545

## 2020-01-08 NOTE — ED Provider Notes (Signed)
US findings reviewed.  No torsion.  Hemorrhagic ovarian cyst noted.  Discussed findings with patient.  Stable for discharge.   Dorie Rank, MD 01/08/20 Lurline Hare

## 2020-01-08 NOTE — Discharge Instructions (Signed)
Please take the antibiotic as prescribed for your possible sexually transmitted disease.  If your gonorrhea chlamydia test come back positive, you should refrain from any sexual activity until you have completed your treatment and your partner(s) has completed treatment.

## 2020-01-08 NOTE — ED Notes (Signed)
Pt ambulatory from triage 

## 2020-01-08 NOTE — ED Triage Notes (Signed)
Patient c/o lower abdominal pain and intermittent nausea since yesterday AM. Patient denies any vaginal discharge or dysuria.

## 2020-01-09 LAB — GC/CHLAMYDIA PROBE AMP (~~LOC~~) NOT AT ARMC
Chlamydia: NEGATIVE
Comment: NEGATIVE
Comment: NORMAL
Neisseria Gonorrhea: NEGATIVE

## 2020-04-09 ENCOUNTER — Ambulatory Visit: Payer: Medicaid Other | Admitting: Family Medicine

## 2020-04-09 ENCOUNTER — Other Ambulatory Visit: Payer: Medicaid Other

## 2020-04-11 ENCOUNTER — Other Ambulatory Visit: Payer: Medicaid Other

## 2020-04-11 DIAGNOSIS — Z20822 Contact with and (suspected) exposure to covid-19: Secondary | ICD-10-CM

## 2020-04-13 LAB — SARS-COV-2, NAA 2 DAY TAT

## 2020-04-13 LAB — NOVEL CORONAVIRUS, NAA: SARS-CoV-2, NAA: NOT DETECTED

## 2020-07-01 ENCOUNTER — Encounter (HOSPITAL_COMMUNITY): Payer: Self-pay

## 2020-07-01 ENCOUNTER — Emergency Department (HOSPITAL_COMMUNITY): Payer: 59

## 2020-07-01 ENCOUNTER — Other Ambulatory Visit: Payer: Self-pay

## 2020-07-01 ENCOUNTER — Emergency Department (HOSPITAL_COMMUNITY)
Admission: EM | Admit: 2020-07-01 | Discharge: 2020-07-01 | Disposition: A | Discharge status: Home / Self Care | Payer: 59 | Attending: Emergency Medicine | Admitting: Emergency Medicine

## 2020-07-01 DIAGNOSIS — Z79899 Other long term (current) drug therapy: Secondary | ICD-10-CM | POA: Insufficient documentation

## 2020-07-01 DIAGNOSIS — Z91040 Latex allergy status: Secondary | ICD-10-CM | POA: Insufficient documentation

## 2020-07-01 DIAGNOSIS — N83202 Unspecified ovarian cyst, left side: Secondary | ICD-10-CM | POA: Diagnosis not present

## 2020-07-01 DIAGNOSIS — J45909 Unspecified asthma, uncomplicated: Secondary | ICD-10-CM | POA: Diagnosis not present

## 2020-07-01 DIAGNOSIS — R1031 Right lower quadrant pain: Secondary | ICD-10-CM | POA: Diagnosis present

## 2020-07-01 DIAGNOSIS — N83201 Unspecified ovarian cyst, right side: Secondary | ICD-10-CM | POA: Diagnosis not present

## 2020-07-01 DIAGNOSIS — R102 Pelvic and perineal pain: Secondary | ICD-10-CM

## 2020-07-01 LAB — COMPREHENSIVE METABOLIC PANEL
ALT: 14 U/L (ref 0–44)
AST: 16 U/L (ref 15–41)
Albumin: 4.3 g/dL (ref 3.5–5.0)
Alkaline Phosphatase: 61 U/L (ref 38–126)
Anion gap: 6 (ref 5–15)
BUN: 10 mg/dL (ref 6–20)
CO2: 26 mmol/L (ref 22–32)
Calcium: 9.1 mg/dL (ref 8.9–10.3)
Chloride: 108 mmol/L (ref 98–111)
Creatinine, Ser: 0.58 mg/dL (ref 0.44–1.00)
GFR, Estimated: >60 mL/min (ref >60)
Glucose, Bld: 115 mg/dL — ABNORMAL HIGH (ref 70–99)
Potassium: 3.5 mmol/L (ref 3.5–5.1)
Sodium: 140 mmol/L (ref 135–145)
Total Bilirubin: 0.4 mg/dL (ref 0.3–1.2)
Total Protein: 7.2 g/dL (ref 6.5–8.1)

## 2020-07-01 LAB — URINALYSIS, ROUTINE W REFLEX MICROSCOPIC
Bilirubin Urine: NEGATIVE
Glucose, UA: NEGATIVE mg/dL
Hgb urine dipstick: NEGATIVE
Ketones, ur: 20 mg/dL — AB
Nitrite: NEGATIVE
Protein, ur: NEGATIVE mg/dL
Specific Gravity, Urine: 1.023 (ref 1.005–1.030)
pH: 6 (ref 5.0–8.0)

## 2020-07-01 LAB — CBC
HCT: 41.8 % (ref 36.0–46.0)
Hemoglobin: 13.3 g/dL (ref 12.0–15.0)
MCH: 29 pg (ref 26.0–34.0)
MCHC: 31.8 g/dL (ref 30.0–36.0)
MCV: 91.1 fL (ref 80.0–100.0)
Platelets: 567 10*3/uL — ABNORMAL HIGH (ref 150–400)
RBC: 4.59 MIL/uL (ref 3.87–5.11)
RDW: 12.9 % (ref 11.5–15.5)
WBC: 3.9 10*3/uL — ABNORMAL LOW (ref 4.0–10.5)
nRBC: 0 % (ref 0.0–0.2)

## 2020-07-01 LAB — I-STAT BETA HCG BLOOD, ED (MC, WL, AP ONLY): I-stat hCG, quantitative: <5 m[IU]/mL (ref <5)

## 2020-07-01 LAB — LIPASE, BLOOD: Lipase: 40 U/L (ref 11–51)

## 2020-07-01 MED ORDER — OXYCODONE-ACETAMINOPHEN 5-325 MG PO TABS
1.0000 | ORAL_TABLET | Freq: Once | ORAL | Status: AC
Start: 2020-07-01 — End: 2020-07-01
  Administered 2020-07-01: 1 via ORAL
  Filled 2020-07-01: qty 1

## 2020-07-01 NOTE — ED Provider Notes (Signed)
[place] [facility] DEPT Provider Note   CSN: [id number] Arrival date & time: 07/01/20  1821     History Chief Complaint  Patient presents with  . Abdominal Pain    Dawn Walton is a 25 y.o. female.  The history is provided by the patient.  Abdominal Pain Pain location:  RLQ and LLQ Pain quality: dull and stabbing   Pain radiates to:  Does not radiate Pain severity:  Moderate Onset quality:  Gradual Duration:  3 days Timing:  Constant Progression:  Worsening Chronicity:  New Context comment:  History of endometriosis and ovarian cysts Relieved by:  Nothing Exacerbated by: standing. Associated symptoms: nausea   Associated symptoms: no chest pain, no chills, no cough, no diarrhea, no dysuria, no fever, no hematuria, no shortness of breath, no sore throat, no vaginal discharge and no vomiting  Vaginal bleeding: some spotting after intercourse yesterday.        Past Medical History:  Diagnosis Date  . Allergy   . Anxiety   . Asthma    unclear per pt  . Endometriosis   . GERD (gastroesophageal reflux disease)   . Headache    MIGRAINES OCCASIONALLY  . Obesity   . UTI (urinary tract infection)     Patient Active Problem List   Diagnosis Date Noted  . URI (upper respiratory infection) 05/02/2014  . Candida vaginitis 05/02/2014  . Suprapubic pain 03/21/2014  . Leukopenia 07/03/2013  . Obesity 05/04/2013  . Contraception 05/04/2013  . Menorrhagia 04/14/2013    Past Surgical History:  Procedure Laterality Date  . LAPAROSCOPY N/A 08/27/2017   Procedure: LAPAROSCOPY OPERATIVE; ABLATION OF ENDOMETRIAL IMPLANTS; STRIPPING OF PERITONEUM;  Surgeon: Ward, Honor Loh, MD;  Location: [facility] ORS;  Service: Gynecology;  Laterality: N/A;  . NO PAST SURGERIES       OB History    Gravida  2   Para      Term      Preterm      AB  2   Living  0     SAB      IAB      Ectopic      Multiple      Live Births              Family  History  Problem Relation Age of Onset  . Hypertension Mother   . Thyroid disease Mother   . Hyperlipidemia Maternal Grandmother   . Hypertension Maternal Grandmother   . Diabetes Maternal Grandfather   . Heart disease Maternal Grandfather   . Stroke Maternal Grandfather     Social History   Tobacco Use  . Smoking status: Never Smoker  . Smokeless tobacco: Never Used  Vaping Use  . Vaping Use: Never used  Substance Use Topics  . Alcohol use: Yes    Alcohol/week: 0.0 standard drinks    Comment: occ  . Drug use: No    Home Medications Prior to Admission medications   Medication Sig Start Date End Date Taking? Authorizing Provider  albuterol (PROVENTIL HFA;VENTOLIN HFA) 108 (90 BASE) MCG/ACT inhaler Inhale into the lungs every 6 (six) hours as needed for wheezing or shortness of breath.    [provider]    Allergies    Apple, Carrot [daucus carota], Celery oil, Tomato, and Latex  Review of Systems   Review of Systems  Constitutional: Negative for chills and fever.  HENT: Negative for ear pain and sore throat.   Eyes: Negative for pain and visual  disturbance.  Respiratory: Negative for cough and shortness of breath.   Cardiovascular: Negative for chest pain and palpitations.  Gastrointestinal: Positive for abdominal pain and nausea. Negative for diarrhea and vomiting.  Genitourinary: Negative for dysuria, hematuria and vaginal discharge. Vaginal bleeding: some spotting after intercourse yesterday.  Musculoskeletal: Negative for arthralgias and back pain.  Skin: Negative for color change and rash.  Neurological: Negative for seizures and syncope.  All other systems reviewed and are negative.   Physical Exam Updated Vital Signs BP (!) 138/100 (BP Location: Left Arm)   Pulse 74   Temp 98.3 F (36.8 C) (Oral)   Resp 16   Ht 5\' 2"  (1.575 m)   Wt 117.9 kg   LMP 06/24/2020 (Approximate)   SpO2 100%   BMI 47.55 kg/m   Physical Exam Vitals and nursing  note reviewed.  HENT:     Head: Normocephalic and atraumatic.  Eyes:     General: No scleral icterus. Pulmonary:     Effort: Pulmonary effort is normal. No respiratory distress.  Abdominal:     Tenderness: There is abdominal tenderness in the right lower quadrant and left lower quadrant. There is no guarding or rebound.  Musculoskeletal:     Cervical back: Normal range of motion.  Skin:    General: Skin is warm and dry.  Neurological:     General: No focal deficit present.     Mental Status: She is alert.  Psychiatric:        Mood and Affect: Mood normal.     ED Results / Procedures / Treatments   Labs (all labs ordered are listed, but only abnormal results are displayed) Labs Reviewed  COMPREHENSIVE METABOLIC PANEL - Abnormal; Notable for the following components:      Result Value   Glucose, Bld 115 (*)    All other components within normal limits  CBC - Abnormal; Notable for the following components:   WBC 3.9 (*)    Platelets 567 (*)    All other components within normal limits  URINALYSIS, ROUTINE W REFLEX MICROSCOPIC - Abnormal; Notable for the following components:   APPearance HAZY (*)    Ketones, ur 20 (*)    Leukocytes,Ua LARGE (*)    Bacteria, UA RARE (*)    All other components within normal limits  LIPASE, BLOOD  I-STAT BETA HCG BLOOD, ED ([facility], [facility], [facility] ONLY)    EKG None  Radiology US PELVIC COMPLETE W TRANSVAGINAL AND TORSION R/O  Result Date: 07/01/2020 CLINICAL DATA:  Pelvic pain for 3 days, history of UTI, obesity, endometriosis, LMP 06/21/2020 EXAM: TRANSABDOMINAL AND TRANSVAGINAL ULTRASOUND OF PELVIS DOPPLER ULTRASOUND OF OVARIES TECHNIQUE: Both transabdominal and transvaginal ultrasound examinations of the pelvis were performed. Transabdominal technique was performed for global imaging of the pelvis including uterus, ovaries, adnexal regions, and pelvic cul-de-sac. It was necessary to proceed with endovaginal exam following the transabdominal exam to  visualize the uterus, endometrium, cervix and adnexa. Color and duplex Doppler ultrasound was utilized to evaluate blood flow to the ovaries. COMPARISON:  Pelvic ultrasound 01/08/2020 FINDINGS: Uterus Measurements: 7.3 x 2.8 x 3.8 cm = volume: 7.3 mL. Anteverted uterus. No concerning focal lesion. Endometrium Thickness: 7.9 mm, non thickened. Trace anechoic free fluid within the endometrial canal towards the lower uterine segment. Right ovary Measurements: 3.9 x 2.9 x 3 cm = volume: 17.5 mL. Multiple anechoic follicles appear organized around a slightly echogenic central stroma. Please note this evaluation is not tailored for enumeration of follicles. No concerning adnexal  mass or lesion. Left ovary Measurements: 3.6 x 2.7 x 2.7 cm = volume: 13.5 mL. Multiple anechoic follicles appear organized around a slightly echogenic central stroma. Please note, this evaluation is not tailored for enumeration of the follicles. No concerning adnexal mass or lesion. Pulsed Doppler evaluation of both ovaries demonstrates normal low-resistance arterial and venous waveforms. Other findings Small volume anechoic free fluid noted in the deep pelvis and posterior cul-de-sac. IMPRESSION: 1. No concerning pelvic mass or evidence of ovarian torsion. 2. Trace anechoic fluid in the lower uterine segment endometrial canal, nonspecific. 3. Small volume anechoic free fluid in the deep pelvis and posterior cul-de-sac, often physiologic in a reproductive age female. 4. Mild enlargement of the bilateral ovaries with multiple anechoic follicles organized around a slightly echogenic central stroma. Finding is nonspecific but can be seen in the setting of polycystic ovarian syndrome. Consider correlation with clinical exam findings and serologic assessment as warranted. Electronically Signed   By: Lovena Le M.D.   On: 07/01/2020 20:25    Procedures Procedures   Medications Ordered in ED Medications  oxyCODONE-acetaminophen  (PERCOCET/ROXICET) 5-325 MG per tablet 1 tablet (has no administration in time range)    ED Course  I have reviewed the triage vital signs and the nursing notes.  Pertinent labs & imaging results that were available during my care of the patient were reviewed by me and considered in my medical decision making (see chart for details).    MDM Rules/Calculators/A&P                          Colena Klunder presents with lower abdominal pain.  Pain seems pelvic in etiology, and she does have a history of ovarian cyst.  Overall, her abdomen is soft, nontender, and not suggestive of serious intra-abdominal pathology.  Pelvic ultrasound was ordered and revealed multiple ovarian cyst but no evidence of ovarian torsion or other emergent etiology of pain.  UA appears to be contaminated by squamous cells, and I am not inclined to treat.  Patient declined testing for sexually transmitted infections, and she is in a monogamous relationship.  I did consider whether or not the patient needed a CT scan, but at this point I do not think the risk/benefit ratio is in favor of this diagnostic modality.  Symptoms do not seem consistent with intestinal obstruction, diverticulitis, appendicitis. Final Clinical Impression(s) / ED Diagnoses Final diagnoses:  Pelvic pain in female  Bilateral ovarian cysts    Rx / DC Orders ED Discharge Orders    None       Arnaldo Natal, MD 07/01/20 2037

## 2020-07-01 NOTE — ED Triage Notes (Signed)
Patient c/o bilateral lower abdominal pain x 3 days. Patient denies any N/v/D.

## 2020-07-08 ENCOUNTER — Encounter (HOSPITAL_COMMUNITY): Payer: Self-pay

## 2020-07-08 ENCOUNTER — Emergency Department (HOSPITAL_COMMUNITY)
Admission: EM | Admit: 2020-07-08 | Discharge: 2020-07-08 | Disposition: A | Discharge status: Home / Self Care | Payer: 59 | Attending: Emergency Medicine | Admitting: Emergency Medicine

## 2020-07-08 ENCOUNTER — Other Ambulatory Visit: Payer: Self-pay

## 2020-07-08 ENCOUNTER — Emergency Department (HOSPITAL_COMMUNITY): Payer: 59

## 2020-07-08 DIAGNOSIS — J45909 Unspecified asthma, uncomplicated: Secondary | ICD-10-CM | POA: Diagnosis not present

## 2020-07-08 DIAGNOSIS — N939 Abnormal uterine and vaginal bleeding, unspecified: Secondary | ICD-10-CM | POA: Insufficient documentation

## 2020-07-08 DIAGNOSIS — R110 Nausea: Secondary | ICD-10-CM | POA: Insufficient documentation

## 2020-07-08 DIAGNOSIS — R1031 Right lower quadrant pain: Secondary | ICD-10-CM | POA: Diagnosis present

## 2020-07-08 DIAGNOSIS — Z91040 Latex allergy status: Secondary | ICD-10-CM | POA: Diagnosis not present

## 2020-07-08 DIAGNOSIS — R1030 Lower abdominal pain, unspecified: Secondary | ICD-10-CM

## 2020-07-08 LAB — CBC
HCT: 39 % (ref 36.0–46.0)
Hemoglobin: 12.6 g/dL (ref 12.0–15.0)
MCH: 29.2 pg (ref 26.0–34.0)
MCHC: 32.3 g/dL (ref 30.0–36.0)
MCV: 90.5 fL (ref 80.0–100.0)
Platelets: 522 10*3/uL — ABNORMAL HIGH (ref 150–400)
RBC: 4.31 MIL/uL (ref 3.87–5.11)
RDW: 12.8 % (ref 11.5–15.5)
WBC: 3.8 10*3/uL — ABNORMAL LOW (ref 4.0–10.5)
nRBC: 0 % (ref 0.0–0.2)

## 2020-07-08 LAB — URINALYSIS, ROUTINE W REFLEX MICROSCOPIC
Bacteria, UA: NONE SEEN
Bilirubin Urine: NEGATIVE
Glucose, UA: NEGATIVE mg/dL
Hgb urine dipstick: NEGATIVE
Ketones, ur: 20 mg/dL — AB
Nitrite: NEGATIVE
Protein, ur: NEGATIVE mg/dL
Specific Gravity, Urine: 1.025 (ref 1.005–1.030)
pH: 7 (ref 5.0–8.0)

## 2020-07-08 LAB — COMPREHENSIVE METABOLIC PANEL
ALT: 17 U/L (ref 0–44)
AST: 14 U/L — ABNORMAL LOW (ref 15–41)
Albumin: 4.2 g/dL (ref 3.5–5.0)
Alkaline Phosphatase: 62 U/L (ref 38–126)
Anion gap: 9 (ref 5–15)
BUN: 9 mg/dL (ref 6–20)
CO2: 26 mmol/L (ref 22–32)
Calcium: 9.1 mg/dL (ref 8.9–10.3)
Chloride: 106 mmol/L (ref 98–111)
Creatinine, Ser: 0.91 mg/dL (ref 0.44–1.00)
GFR, Estimated: >60 mL/min (ref >60)
Glucose, Bld: 98 mg/dL (ref 70–99)
Potassium: 3.4 mmol/L — ABNORMAL LOW (ref 3.5–5.1)
Sodium: 141 mmol/L (ref 135–145)
Total Bilirubin: 0.1 mg/dL — ABNORMAL LOW (ref 0.3–1.2)
Total Protein: 7.2 g/dL (ref 6.5–8.1)

## 2020-07-08 LAB — LIPASE, BLOOD: Lipase: 44 U/L (ref 11–51)

## 2020-07-08 LAB — I-STAT BETA HCG BLOOD, ED (MC, WL, AP ONLY): I-stat hCG, quantitative: <5 m[IU]/mL (ref <5)

## 2020-07-08 MED ORDER — ONDANSETRON 4 MG PO TBDP: 4.0000 mg | ORAL_TABLET | Freq: Once | ORAL | Status: DC

## 2020-07-08 MED ORDER — KETOROLAC TROMETHAMINE 30 MG/ML IJ SOLN
30.0000 mg | Freq: Once | INTRAMUSCULAR | Status: AC
  Administered 2020-07-08: 30 mg via INTRAVENOUS
  Filled 2020-07-08: qty 1

## 2020-07-08 MED ORDER — IOHEXOL 300 MG/ML  SOLN
100.0000 mL | Freq: Once | INTRAMUSCULAR | Status: AC | PRN
  Administered 2020-07-08: 100 mL via INTRAVENOUS

## 2020-07-08 MED ORDER — ONDANSETRON HCL 4 MG/2ML IJ SOLN
4.0000 mg | Freq: Once | INTRAMUSCULAR | Status: AC
  Administered 2020-07-08: 4 mg via INTRAVENOUS
  Filled 2020-07-08: qty 2

## 2020-07-08 MED ORDER — ONDANSETRON 4 MG PO TBDP: 4.0000 mg | ORAL_TABLET | Freq: Three times a day (TID) | ORAL | 0 refills | Status: DC | PRN

## 2020-07-08 NOTE — Discharge Instructions (Signed)
Please follow-up with gynecology regarding your ongoing symptoms. Return if you have heavy vaginal bleeding that is not improving any began feeling faint, or severely worsening abdominal pain with fever, uncontrollable vomiting, or other concerning symptoms.

## 2020-07-08 NOTE — ED Provider Notes (Signed)
[place] DEPT Provider Note   CSN: [id number] Arrival date & time: 07/08/20  1412     History Chief Complaint  Patient presents with  . Abdominal Pain  . Nausea    Dawn Walton is a 25 y.o. female past medical history of menorrhagia, presenting for evaluation of abdominal pain.  Patient was evaluated on 07/01/2020 for bilateral lower abdominal pain.  Per review of medical record, she had pelvic ultrasound done which showed bilateral ovarian cysts.  Remainder of work-up is reassuring.  She was discharged with symptomatic management.  She states today she began having vaginal bleeding at 11 AM which was heavy at first, however is improving on evaluation.  She states the pain has now favoring the right lower quadrant with some nausea.  No fevers, urinary symptoms, diarrhea, constipation.  LMP was 06/19/2020.  The history is provided by the patient and medical records.       Past Medical History:  Diagnosis Date  . Allergy   . Anxiety   . Asthma    unclear per pt  . Endometriosis   . GERD (gastroesophageal reflux disease)   . Headache    MIGRAINES OCCASIONALLY  . Obesity   . UTI (urinary tract infection)     Patient Active Problem List   Diagnosis Date Noted  . URI (upper respiratory infection) 05/02/2014  . Candida vaginitis 05/02/2014  . Suprapubic pain 03/21/2014  . Leukopenia 07/03/2013  . Obesity 05/04/2013  . Contraception 05/04/2013  . Menorrhagia 04/14/2013    Past Surgical History:  Procedure Laterality Date  . LAPAROSCOPY N/A 08/27/2017   Procedure: LAPAROSCOPY OPERATIVE; ABLATION OF ENDOMETRIAL IMPLANTS; STRIPPING OF PERITONEUM;  Surgeon: Ward, Honor Loh, MD;  Location: [facility] ORS;  Service: Gynecology;  Laterality: N/A;  . NO PAST SURGERIES       OB History    Gravida  2   Para      Term      Preterm      AB  2   Living  0     SAB      IAB      Ectopic      Multiple      Live Births               Family History  Problem Relation Age of Onset  . Hypertension Mother   . Thyroid disease Mother   . Hyperlipidemia Maternal Grandmother   . Hypertension Maternal Grandmother   . Diabetes Maternal Grandfather   . Heart disease Maternal Grandfather   . Stroke Maternal Grandfather     Social History   Tobacco Use  . Smoking status: Never Smoker  . Smokeless tobacco: Never Used  Vaping Use  . Vaping Use: Never used  Substance Use Topics  . Alcohol use: Yes    Alcohol/week: 0.0 standard drinks    Comment: occ  . Drug use: No    Home Medications Prior to Admission medications   Medication Sig Start Date End Date Taking? Authorizing Provider  albuterol (PROVENTIL HFA;VENTOLIN HFA) 108 (90 BASE) MCG/ACT inhaler Inhale into the lungs every 6 (six) hours as needed for wheezing or shortness of breath.    [provider]    Allergies    Apple, Carrot [daucus carota], Celery oil, Tomato, and Latex  Review of Systems   Review of Systems  Gastrointestinal: Positive for abdominal pain and nausea.  Genitourinary: Positive for vaginal bleeding.  All other systems reviewed and are negative.  Physical Exam Updated Vital Signs BP 128/89   Pulse 83   Temp 97.9 F (36.6 C) (Oral)   Resp 18   Ht 5\' 2"  (1.575 m)   Wt 117.9 kg   LMP 06/24/2020 (Approximate)   SpO2 100%   BMI 47.55 kg/m   Physical Exam Vitals and nursing note reviewed.  Constitutional:      General: She is not in acute distress.    Appearance: She is well-developed. She is obese. She is not ill-appearing.  HENT:     Head: Normocephalic and atraumatic.  Eyes:     Conjunctiva/sclera: Conjunctivae normal.  Cardiovascular:     Rate and Rhythm: Normal rate and regular rhythm.  Pulmonary:     Effort: Pulmonary effort is normal. No respiratory distress.     Breath sounds: Normal breath sounds.  Abdominal:     General: Bowel sounds are normal.     Palpations: Abdomen is soft.     Tenderness: There  is abdominal tenderness in the right lower quadrant, suprapubic area and left lower quadrant. There is no guarding or rebound.  Skin:    General: Skin is warm.  Neurological:     Mental Status: She is alert.  Psychiatric:        Behavior: Behavior normal.     ED Results / Procedures / Treatments   Labs (all labs ordered are listed, but only abnormal results are displayed) Labs Reviewed  COMPREHENSIVE METABOLIC PANEL - Abnormal; Notable for the following components:      Result Value   Potassium 3.4 (*)    AST 14 (*)    Total Bilirubin 0.1 (*)    All other components within normal limits  CBC - Abnormal; Notable for the following components:   WBC 3.8 (*)    Platelets 522 (*)    All other components within normal limits  URINALYSIS, ROUTINE W REFLEX MICROSCOPIC - Abnormal; Notable for the following components:   APPearance HAZY (*)    Ketones, ur 20 (*)    Leukocytes,Ua LARGE (*)    All other components within normal limits  LIPASE, BLOOD  I-STAT BETA HCG BLOOD, ED ([facility], [facility], [facility] ONLY)    EKG None  Radiology CT Abdomen Pelvis W Contrast  Result Date: 07/08/2020 CLINICAL DATA:  25 year old female with right lower quadrant abdominal pain. EXAM: CT ABDOMEN AND PELVIS WITH CONTRAST TECHNIQUE: Multidetector CT imaging of the abdomen and pelvis was performed using the standard protocol following bolus administration of intravenous contrast. CONTRAST:  132mL OMNIPAQUE IOHEXOL 300 MG/ML  SOLN COMPARISON:  CT abdomen pelvis dated 01/08/2020. FINDINGS: Lower chest: The visualized lung bases are clear. No intra-abdominal free air or free fluid. Hepatobiliary: No focal liver abnormality is seen. No gallstones, gallbladder wall thickening, or biliary dilatation. Pancreas: Unremarkable. No pancreatic ductal dilatation or surrounding inflammatory changes. Spleen: Normal in size without focal abnormality. Adrenals/Urinary Tract: The adrenal glands unremarkable. The kidneys,, visualized ureters, and  urinary bladder appear unremarkable. Stomach/Bowel: There is no bowel obstruction or active inflammation. The appendix is normal. Vascular/Lymphatic: Abdominal aorta and IVC unremarkable. No portal venous gas. There is no adenopathy. Reproductive: The uterus is anteverted.  No adnexal masses Other: None Musculoskeletal: No acute or significant osseous findings. IMPRESSION: No acute intra-abdominal or pelvic pathology. No bowel obstruction. Normal appendix. Electronically Signed   By: Anner Crete M.D.   On: 07/08/2020 16:57    Procedures Procedures   Medications Ordered in ED Medications  iohexol (OMNIPAQUE) 300 MG/ML solution 100 mL (100  mLs Intravenous Contrast Given 07/08/20 1625)    ED Course  I have reviewed the triage vital signs and the nursing notes.  Pertinent labs & imaging results that were available during my care of the patient were reviewed by me and considered in my medical decision making (see chart for details).    MDM Rules/Calculators/A&P                          Patient presenting for reevaluation of lower abdominal pain.  She was seen in the ED on 07/01/2020 with pelvic ultrasound revealed bilateral ovarian cysts.  She had some mild vaginal spotting at the time.  She states pain has now migrated and is favoring the right lower quadrant.  Today around 11 AM she developed heavier vaginal bleeding however it is improving upon evaluation today.  Patient has not followed up outpatient.  Endorses some nausea as well.  Labwork ordered, CT scan to rule out acute appendicitis.  CT is unremarkable.  Blood work is also reassuring.  UA is negative for infection.  Urine hCG is negative.  Given multiple cysts seen on pelvic ultrasound, consider other nonemergent pathology including ovarian cyst, with left knee OA, endometriosis, also consider dysfunctional uterine bleeding considering bleeding in the middle of her menstrual cycle recommend follow-up outpatient with gynecology.  She is  discharged in no distress.  Discussed results, findings, treatment and follow up. Patient advised of return precautions. Patient verbalized understanding and agreed with plan.  Final Clinical Impression(s) / ED Diagnoses Final diagnoses:  Abnormal uterine bleeding  Lower abdominal pain    Rx / DC Orders ED Discharge Orders    None       Myalee Stengel, [place] N, PA-C 07/08/20 1743    Arnaldo Natal, MD 07/08/20 [phone]

## 2020-07-08 NOTE — ED Triage Notes (Signed)
Patient c/o bilateral lower abdominal pain R>L and nausea x 1 week

## 2021-04-18 IMAGING — CT CT ABD-PELV W/ CM
2 of 4 series · 17 of 46 positions shown, 19 images · IV contrast (OMNIPAQUE 300)
Comparison: CT abdomen pelvis dated 01/08/2020.

CLINICAL DATA: 25-year-old female with right lower quadrant
abdominal pain.

EXAM:
CT ABDOMEN AND PELVIS WITH CONTRAST
TECHNIQUE: Multidetector CT imaging of the abdomen and pelvis was performed
using the standard protocol following bolus administration of
intravenous contrast.
CONTRAST:  100mL OMNIPAQUE IOHEXOL 300 MG/ML  SOLN

[Series 2: axial st · axial · 0.86mm/px · z∈[-164,+266]mm · 14 of 98 slices shown, 16 images]
[im 6/98  soft-tissue]
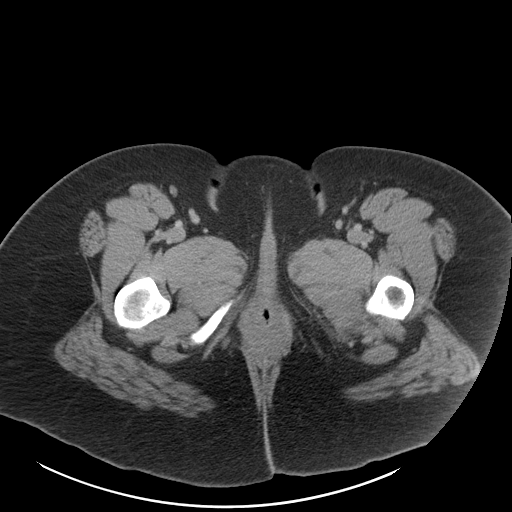
[im 6/98  bone]
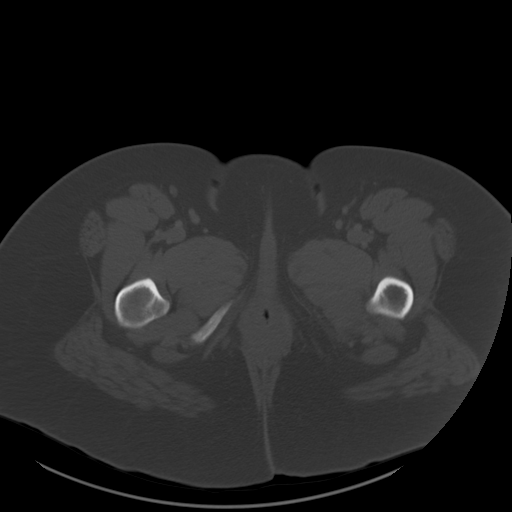
[im 12/98  soft-tissue]
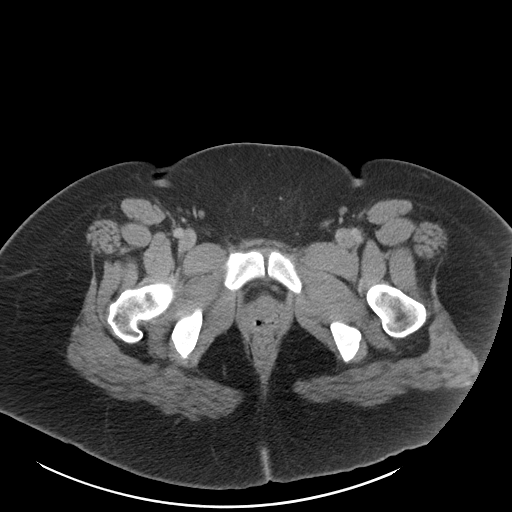
[im 18/98  soft-tissue]
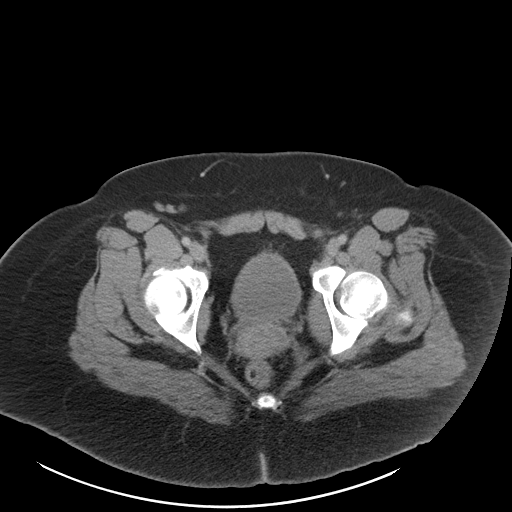
[im 29/98  soft-tissue]
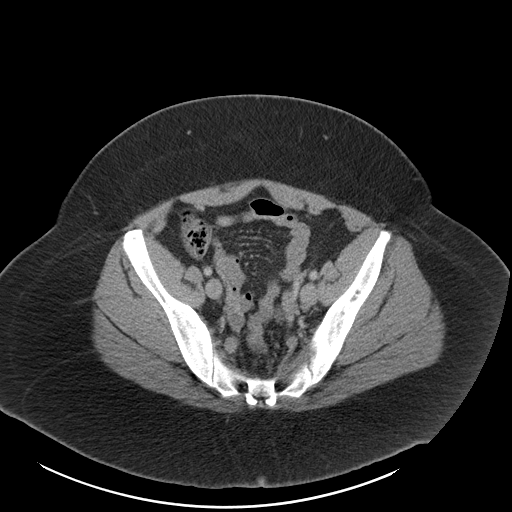
[im 35/98  soft-tissue]
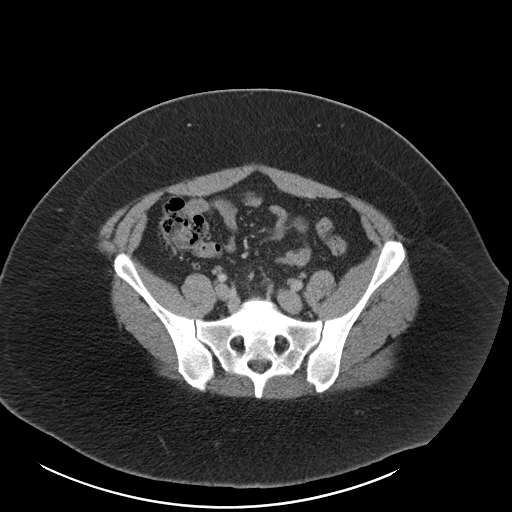
[im 40/98  soft-tissue]
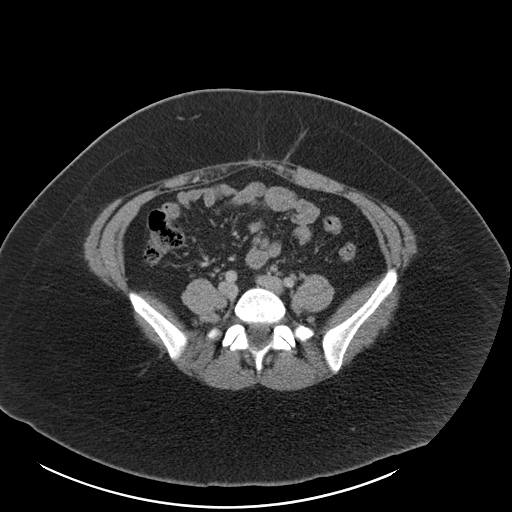
[im 46/98  soft-tissue]
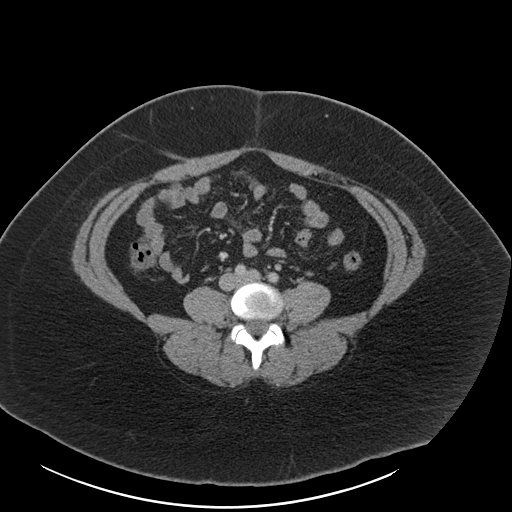
[im 52/98  soft-tissue]
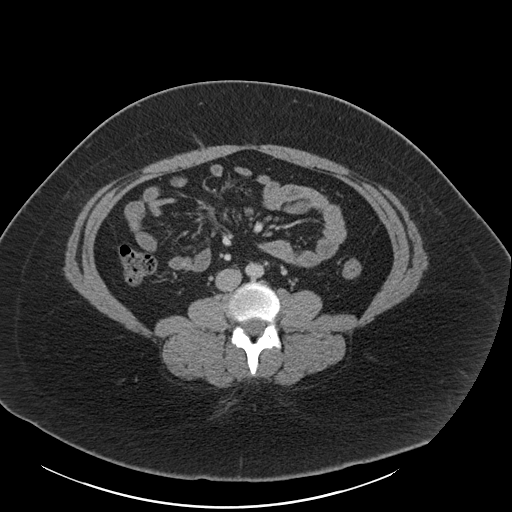
[im 58/98  soft-tissue]
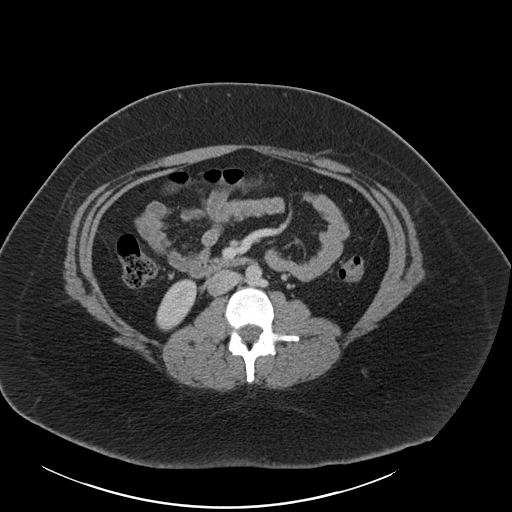
[im 58/98  bone]
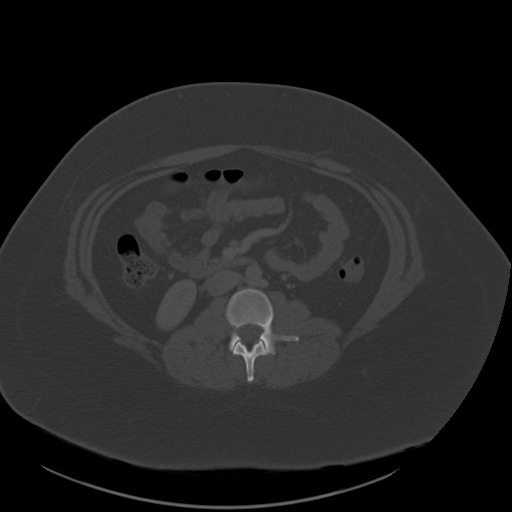
[im 63/98  soft-tissue]
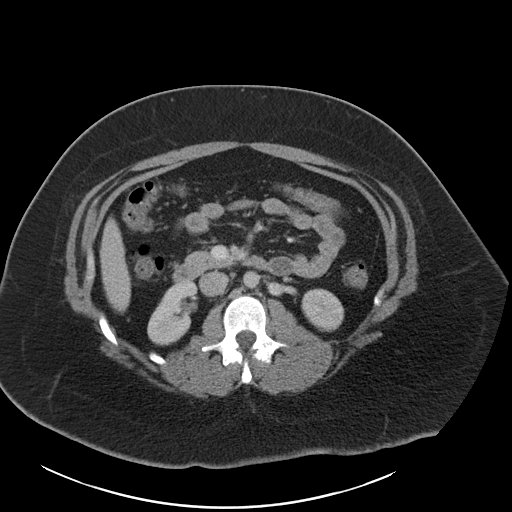
[im 75/98  soft-tissue]
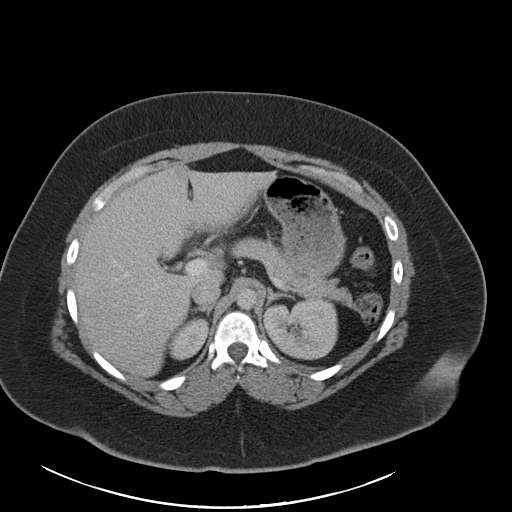
[im 80/98  soft-tissue]
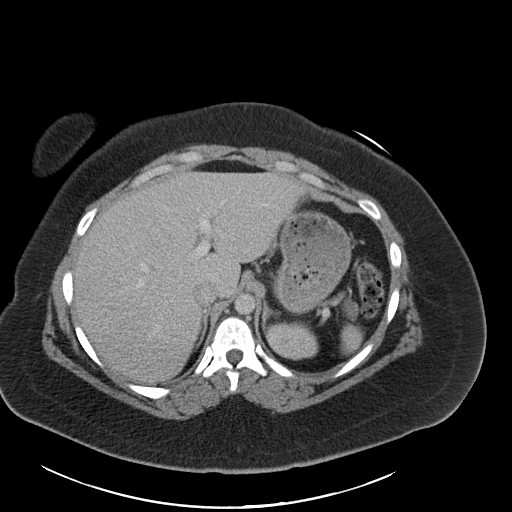
[im 86/98  soft-tissue]
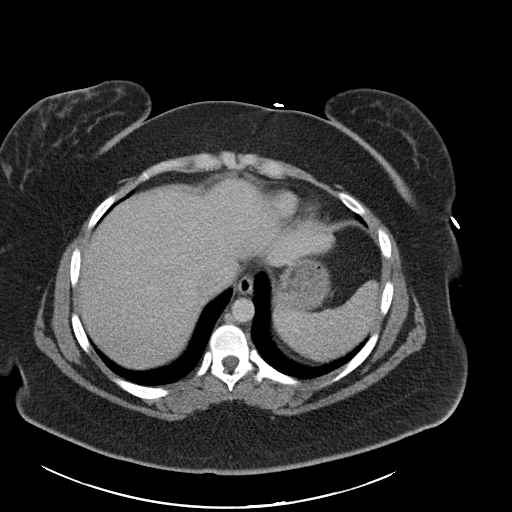
[im 92/98  soft-tissue]
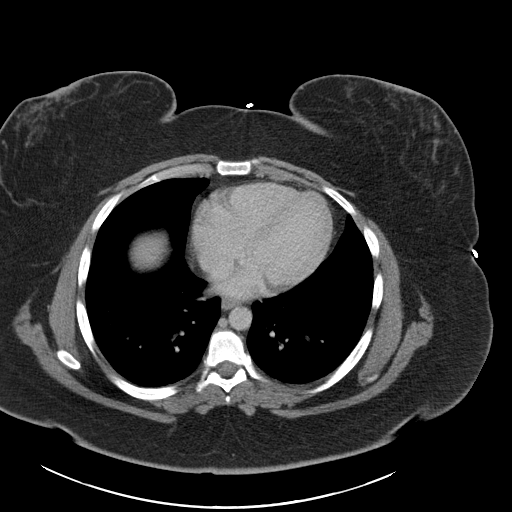

[Series 5: coronal st · coronal · 0.86mm/px · 3 of 151 slices shown]
[im 51/151  soft-tissue]
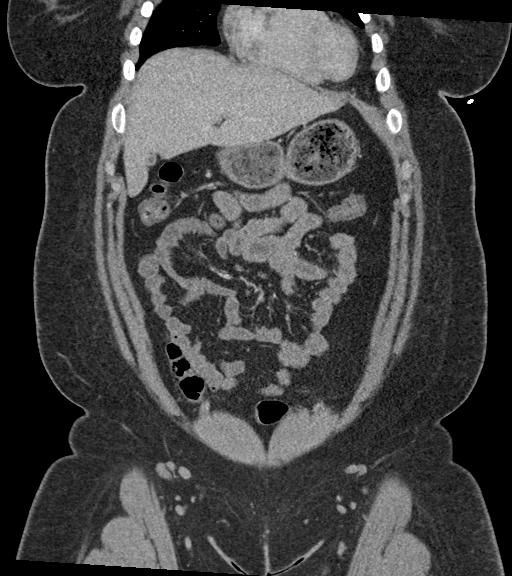
[im 67/151  soft-tissue]
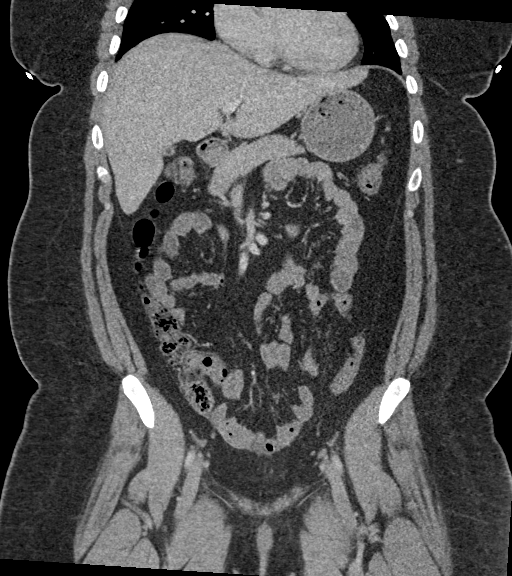
[im 84/151  soft-tissue]
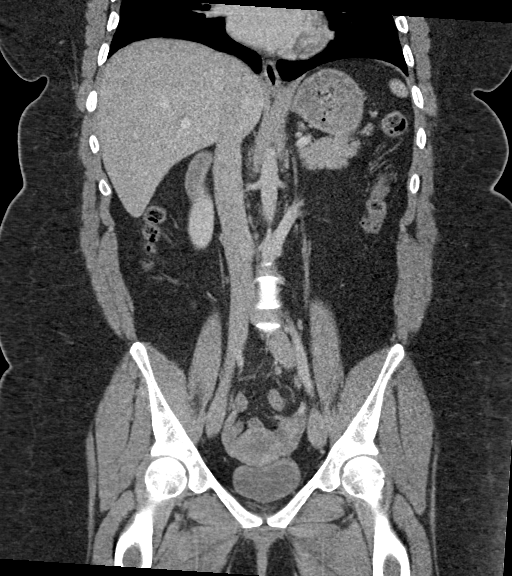

[17 of 46 positions shown; findings below may reference images not displayed]

FINDINGS: Lower chest: The visualized lung bases are clear.

No intra-abdominal free air or free fluid.

Hepatobiliary: No focal liver abnormality is seen. No gallstones,
gallbladder wall thickening, or biliary dilatation.

Pancreas: Unremarkable. No pancreatic ductal dilatation or
surrounding inflammatory changes.

Spleen: Normal in size without focal abnormality.

Adrenals/Urinary Tract: The adrenal glands unremarkable. The
kidneys,, visualized ureters, and urinary bladder appear
unremarkable.

Stomach/Bowel: There is no bowel obstruction or active inflammation.
The appendix is normal.

Vascular/Lymphatic: Abdominal aorta and IVC unremarkable. No portal
venous gas. There is no adenopathy.

Reproductive: The uterus is anteverted.  No adnexal masses

Other: None

Musculoskeletal: No acute or significant osseous findings.
IMPRESSION: No acute intra-abdominal or pelvic pathology. No bowel obstruction.
Normal appendix.

## 2021-04-25 ENCOUNTER — Emergency Department (HOSPITAL_COMMUNITY)
Admission: EM | Admit: 2021-04-25 | Discharge: 2021-04-25 | Discharge status: Against Medical Advice | Payer: Medicaid Other | Attending: Emergency Medicine | Admitting: Emergency Medicine

## 2021-04-25 ENCOUNTER — Encounter (HOSPITAL_COMMUNITY): Payer: Self-pay

## 2021-04-25 ENCOUNTER — Other Ambulatory Visit: Payer: Self-pay

## 2021-04-25 DIAGNOSIS — Z5321 Procedure and treatment not carried out due to patient leaving prior to being seen by health care provider: Secondary | ICD-10-CM | POA: Insufficient documentation

## 2021-04-25 DIAGNOSIS — R1031 Right lower quadrant pain: Secondary | ICD-10-CM | POA: Insufficient documentation

## 2021-04-25 LAB — COMPREHENSIVE METABOLIC PANEL
ALT: 15 U/L (ref 0–44)
AST: 15 U/L (ref 15–41)
Albumin: 4.5 g/dL (ref 3.5–5.0)
Alkaline Phosphatase: 63 U/L (ref 38–126)
Anion gap: 7 (ref 5–15)
BUN: 10 mg/dL (ref 6–20)
CO2: 23 mmol/L (ref 22–32)
Calcium: 8.8 mg/dL — ABNORMAL LOW (ref 8.9–10.3)
Chloride: 107 mmol/L (ref 98–111)
Creatinine, Ser: 0.67 mg/dL (ref 0.44–1.00)
GFR, Estimated: >60 mL/min (ref >60)
Glucose, Bld: 99 mg/dL (ref 70–99)
Potassium: 3.5 mmol/L (ref 3.5–5.1)
Sodium: 137 mmol/L (ref 135–145)
Total Bilirubin: 0.1 mg/dL — ABNORMAL LOW (ref 0.3–1.2)
Total Protein: 7.6 g/dL (ref 6.5–8.1)

## 2021-04-25 LAB — URINALYSIS, ROUTINE W REFLEX MICROSCOPIC
Bacteria, UA: NONE SEEN
Bilirubin Urine: NEGATIVE
Glucose, UA: NEGATIVE mg/dL
Hgb urine dipstick: NEGATIVE
Ketones, ur: 20 mg/dL — AB
Nitrite: NEGATIVE
Protein, ur: NEGATIVE mg/dL
Specific Gravity, Urine: 1.025 (ref 1.005–1.030)
pH: 5 (ref 5.0–8.0)

## 2021-04-25 LAB — CBC
HCT: 39.8 % (ref 36.0–46.0)
Hemoglobin: 12.8 g/dL (ref 12.0–15.0)
MCH: 29.6 pg (ref 26.0–34.0)
MCHC: 32.2 g/dL (ref 30.0–36.0)
MCV: 91.9 fL (ref 80.0–100.0)
Platelets: 518 10*3/uL — ABNORMAL HIGH (ref 150–400)
RBC: 4.33 MIL/uL (ref 3.87–5.11)
RDW: 12.9 % (ref 11.5–15.5)
WBC: 4.9 10*3/uL (ref 4.0–10.5)
nRBC: 0 % (ref 0.0–0.2)

## 2021-04-25 LAB — I-STAT BETA HCG BLOOD, ED (MC, WL, AP ONLY): I-stat hCG, quantitative: <5 m[IU]/mL (ref <5)

## 2021-04-25 LAB — LIPASE, BLOOD: Lipase: 43 U/L (ref 11–51)

## 2021-04-25 NOTE — ED Notes (Signed)
Pt notified for room call. Pt says she wishes to have IV removed so she may go home. Pt aware room is ready. Encouraged to stay. Pt says she will return with worsening sx. VSS. Alert oriented.

## 2021-04-25 NOTE — ED Triage Notes (Signed)
Pt reports RLQ abdominal pain beginning around midnight. Denies n/v/d.

## 2022-02-02 ENCOUNTER — Other Ambulatory Visit: Payer: Self-pay

## 2022-02-02 ENCOUNTER — Emergency Department: Payer: Medicaid Other

## 2022-02-02 ENCOUNTER — Emergency Department
Admission: EM | Admit: 2022-02-02 | Discharge: 2022-02-02 | Disposition: A | Discharge status: Home / Self Care | Payer: Medicaid Other | Attending: Emergency Medicine | Admitting: Emergency Medicine

## 2022-02-02 DIAGNOSIS — N809 Endometriosis, unspecified: Secondary | ICD-10-CM | POA: Insufficient documentation

## 2022-02-02 DIAGNOSIS — N83209 Unspecified ovarian cyst, unspecified side: Secondary | ICD-10-CM

## 2022-02-02 DIAGNOSIS — R102 Pelvic and perineal pain: Secondary | ICD-10-CM

## 2022-02-02 LAB — URINALYSIS, ROUTINE W REFLEX MICROSCOPIC
Bacteria, UA: NONE SEEN
Bilirubin Urine: NEGATIVE
Glucose, UA: NEGATIVE mg/dL
Hgb urine dipstick: NEGATIVE
Ketones, ur: 5 mg/dL — AB
Nitrite: NEGATIVE
Protein, ur: NEGATIVE mg/dL
Specific Gravity, Urine: 1.021 (ref 1.005–1.030)
pH: 8 (ref 5.0–8.0)

## 2022-02-02 LAB — PREGNANCY, URINE: Preg Test, Ur: NEGATIVE

## 2022-02-02 LAB — POC URINE PREG, ED: Preg Test, Ur: NEGATIVE

## 2022-02-02 MED ORDER — MORPHINE SULFATE (PF) 4 MG/ML IV SOLN
4.0000 mg | Freq: Once | INTRAVENOUS | Status: AC
  Administered 2022-02-02: 4 mg via INTRAMUSCULAR
  Filled 2022-02-02: qty 1

## 2022-02-02 MED ORDER — ONDANSETRON 4 MG PO TBDP
4.0000 mg | ORAL_TABLET | Freq: Once | ORAL | Status: AC
  Administered 2022-02-02: 4 mg via ORAL
  Filled 2022-02-02: qty 1

## 2022-02-02 MED ORDER — HYDROCODONE-ACETAMINOPHEN 5-325 MG PO TABS: 1.0000 | ORAL_TABLET | ORAL | 0 refills | Status: DC | PRN

## 2022-02-02 NOTE — ED Triage Notes (Signed)
Pt c/o constant lower abdominal cramping since last night, reports some spotting yesterday none today. Last BM today, H/O endometriosis.

## 2022-02-02 NOTE — ED Notes (Signed)
Light green and purple top obtained and sent to lab. Urine cup given to pt for urine sample- pt not able to urinate at this time.

## 2022-02-02 NOTE — ED Provider Notes (Signed)
[facility] Provider Note    Event Date/Time   First MD Initiated Contact with Patient 02/02/22 [phone]     (approximate)  History   Chief Complaint: Abdominal Cramping (Lower)  HPI  Dawn Walton is a 26 y.o. female with a past medical history anxiety, gastric reflux, endometriosis, presents to the emergency department for lower abdominal/pelvic pain.  According to the patient for the past several years she has been dealing with chronic pelvic/lower abdominal pain due to endometriosis.  Patient states she was diagnosed with a laparoscopic surgery.  Initially tried birth control but it did not help.  Follows up with Dr. Leonides Schanz OB/GYN.  Patient states over the last 24 hours the pain has been worse.  She states the pain feels similar to her endometriosis pain but just more intense.  Patient denies any dysuria denies any fever nausea vomiting or diarrhea.  Patient states very mild vaginal spotting starting yesterday without discharge.  Physical Exam   Triage Vital Signs: ED Triage Vitals  Enc Vitals Group     BP 02/02/22 0900 123/76     Pulse Rate 02/02/22 0900 72     Resp 02/02/22 0900 18     Temp 02/02/22 0900 98.6 F (37 C)     Temp Source 02/02/22 0900 Oral     SpO2 02/02/22 0900 96 %     Weight 02/02/22 0900 270 lb (122.5 kg)     Height 02/02/22 0900 '5\' 2"'$  (1.575 m)     Head Circumference --      Peak Flow --      Pain Score 02/02/22 0915 8     Pain Loc --      Pain Edu? --      Excl. in [place]? --     Most recent vital signs: Vitals:   02/02/22 0900  BP: 123/76  Pulse: 72  Resp: 18  Temp: 98.6 F (37 C)  SpO2: 96%    General: Awake, no distress.  CV:  Good peripheral perfusion.  Regular rate and rhythm  Resp:  Normal effort.  Equal breath sounds bilaterally.  Abd:  No distention.  Soft, mild suprapubic tenderness to palpation without rebound or guarding.   ED Results / Procedures / Treatments   RADIOLOGY  Ultrasound shows likely  hemorrhagic cyst possibly ruptured hemorrhagic cyst.   MEDICATIONS ORDERED IN ED: Medications - No data to display   IMPRESSION / MDM / [place] / ED COURSE  I reviewed the triage vital signs and the nursing notes.  Patient's presentation is most consistent with acute presentation with potential threat to life or bodily function.  Patient with a history of endometriosis states many years of lower abdominal/pelvic pain however worse over the past 24 hours.  Patient denies any other concerning symptoms such as fever dysuria vomiting or diarrhea.  Reassuring vital signs, on exam patient does have mild suprapubic tenderness.  Given the patient's history of endometriosis with similar pain times years I believe this is likely exacerbation of the patient's chronic endometriosis pain.  We will treat with an IM injection of morphine.  We will check a urinalysis as well as a pelvic ultrasound to further evaluate.  Patient agreeable to plan of care.  Patient's urine shows no concerning findings.  Ultrasound most consistent with hemorrhagic cyst possibly ruptured hemorrhagic cyst which is very likely the cause of the patient's pain.  No significant free fluid.  We will place the patient on a short course of  pain medication have her follow-up with her OB/GYN.  Patient agreeable to plan of care.  FINAL CLINICAL IMPRESSION(S) / ED DIAGNOSES   Lower abdominal/pelvic pain Endometriosis  Rx / DC Orders   [place]  Note:  This document was prepared using Dragon voice recognition software and may include unintentional dictation errors.   Harvest Dark, MD 02/02/22 [phone]

## 2022-02-02 NOTE — Discharge Instructions (Addendum)
Please follow-up with your OB/GYN regarding today's ER visit and your ovarian cyst.  Your work-up is most consistent with a hemorrhagic cyst which is an ovarian cyst that is filled with blood that can cause pain when they rupture.  Typically the pain is short-lived 2 to 3 days and then resolves.  If you continue to have or develop significant pain please return to the emergency department for recheck/reevaluation.

## 2022-03-03 ENCOUNTER — Telehealth: Payer: Self-pay

## 2022-03-03 NOTE — Telephone Encounter (Signed)
Left message for pt to call office back regarding ins coverage for visit on 04/29/22.

## 2022-04-21 ENCOUNTER — Emergency Department
Admission: EM | Admit: 2022-04-21 | Discharge: 2022-04-21 | Disposition: A | Discharge status: Home / Self Care | Payer: Medicaid Other | Attending: Emergency Medicine | Admitting: Emergency Medicine

## 2022-04-21 ENCOUNTER — Other Ambulatory Visit: Payer: Self-pay

## 2022-04-21 ENCOUNTER — Emergency Department: Payer: Medicaid Other

## 2022-04-21 DIAGNOSIS — R1031 Right lower quadrant pain: Secondary | ICD-10-CM | POA: Diagnosis not present

## 2022-04-21 LAB — URINALYSIS, ROUTINE W REFLEX MICROSCOPIC
Bilirubin Urine: NEGATIVE
Glucose, UA: NEGATIVE mg/dL
Hgb urine dipstick: NEGATIVE
Ketones, ur: 20 mg/dL — AB
Leukocytes,Ua: NEGATIVE
Nitrite: NEGATIVE
Protein, ur: NEGATIVE mg/dL
Specific Gravity, Urine: 1.024 (ref 1.005–1.030)
pH: 6 (ref 5.0–8.0)

## 2022-04-21 LAB — POC URINE PREG, ED
Preg Test, Ur: NEGATIVE
Preg Test, Ur: NEGATIVE

## 2022-04-21 LAB — CBC
HCT: 39.4 % (ref 36.0–46.0)
Hemoglobin: 12.7 g/dL (ref 12.0–15.0)
MCH: 28.4 pg (ref 26.0–34.0)
MCHC: 32.2 g/dL (ref 30.0–36.0)
MCV: 88.1 fL (ref 80.0–100.0)
Platelets: 515 10*3/uL — ABNORMAL HIGH (ref 150–400)
RBC: 4.47 MIL/uL (ref 3.87–5.11)
RDW: 12.6 % (ref 11.5–15.5)
WBC: 3.3 10*3/uL — ABNORMAL LOW (ref 4.0–10.5)
nRBC: 0 % (ref 0.0–0.2)

## 2022-04-21 LAB — COMPREHENSIVE METABOLIC PANEL
ALT: 13 U/L (ref 0–44)
AST: 15 U/L (ref 15–41)
Albumin: 4 g/dL (ref 3.5–5.0)
Alkaline Phosphatase: 58 U/L (ref 38–126)
Anion gap: 6 (ref 5–15)
BUN: 10 mg/dL (ref 6–20)
CO2: 25 mmol/L (ref 22–32)
Calcium: 8.6 mg/dL — ABNORMAL LOW (ref 8.9–10.3)
Chloride: 105 mmol/L (ref 98–111)
Creatinine, Ser: 0.73 mg/dL (ref 0.44–1.00)
GFR, Estimated: >60 mL/min (ref >60)
Glucose, Bld: 107 mg/dL — ABNORMAL HIGH (ref 70–99)
Potassium: 3.5 mmol/L (ref 3.5–5.1)
Sodium: 136 mmol/L (ref 135–145)
Total Bilirubin: 0.4 mg/dL (ref 0.3–1.2)
Total Protein: 7.1 g/dL (ref 6.5–8.1)

## 2022-04-21 LAB — LIPASE, BLOOD: Lipase: 37 U/L (ref 11–51)

## 2022-04-21 MED ORDER — KETOROLAC TROMETHAMINE 15 MG/ML IJ SOLN
15.0000 mg | Freq: Once | INTRAMUSCULAR | Status: DC
  Filled 2022-04-21: qty 1

## 2022-04-21 MED ORDER — ACETAMINOPHEN 500 MG PO TABS
1000.0000 mg | ORAL_TABLET | Freq: Once | ORAL | Status: AC
  Administered 2022-04-21: 1000 mg via ORAL
  Filled 2022-04-21: qty 2

## 2022-04-21 MED ORDER — KETOROLAC TROMETHAMINE 15 MG/ML IJ SOLN
15.0000 mg | Freq: Once | INTRAMUSCULAR | Status: AC
  Administered 2022-04-21: 15 mg via INTRAMUSCULAR

## 2022-04-21 NOTE — Discharge Instructions (Signed)
Thank you for choosing us for your health care today!  Please see your primary doctor this week for a follow up appointment.   Sometimes, in the early stages of certain disease courses it is difficult to detect in the emergency department evaluation -- so, it is important that you continue to monitor your symptoms and call your doctor right away or return to the emergency department if you develop any new or worsening symptoms.  Please go to the following website to schedule new (and existing) patient appointments:   [email or web address]  If you do not have a primary doctor try calling the following clinics to establish care:  If you have insurance:  [facility] [phone] [address] [place] [address]   [facility]  [phone] [address] [place] [address] [place] [address]   If you do not have insurance:  [facility]  [phone] [address] [place] [address]   The following is another list of primary care offices in the area who are accepting new patients at this time.  Please reach out to one of them directly and let them know you would like to schedule an appointment to follow up on an Emergency Department visit, and/or to establish a new primary care provider (PCP).  There are likely other primary care clinics in the are who are accepting new patients, but this is an excellent place to start:  [facility] Lead physician: Dr Angela Bacigalupo [address] [place], [place] [address] [phone]  [facility] Lead Physician: Dr Krichna Sowles [address], [place], [place] [address] [phone]  [facility]  Lead Physician: Dr Megan Johnson [address], [place], [place] [address] [phone]  [facility] [place] [facility] Lead Physician: Dr Alex Karamalegos [address], [place], [place] [address] [phone]  [facility] [place] Lead Physician: Dr Laura Berglund [address], [place],  [address] [phone]   It was my pleasure to care for you today.   Demarious Kapur S. Jonessa Triplett, MD  

## 2022-04-21 NOTE — ED Triage Notes (Signed)
Pt to ED via POV from home. Pt reports lower abdominal pain that has been going on for a few days. Pt denies N/V/D or fevers. Pt reports has noticed some spotting. Pt denies chance of pregnancy. Pt reports hx of right ovarian cyst and feels similar.

## 2022-04-21 NOTE — ED Notes (Signed)
See triage note. Pt arrives from home with family at bedside. Pt has been experiencing lower abdominal pain of the right side. Pt says it has been happening for a couple of days. Denies any N/V/D occurring.

## 2022-04-21 NOTE — ED Provider Notes (Signed)
[facility] Provider Note    Event Date/Time   First MD Initiated Contact with Patient 04/21/22 [phone]     (approximate)   History   Abdominal Pain   HPI  Dawn Walton is a 27 y.o. female   Past medical history of endometriosis who presents to the emergency department with lower abdominal pain for the past several days intermittent with some spotting vaginal bleeding last menstrual period was approximately 3 weeks ago she has a known right ovarian cyst which is caused pain in the past and this feels similar.  No nausea or vomiting or diarrhea.  No fever or chills.  No dysuria.  No vaginal discharge.  History was obtained via the patient.  I reviewed external medical notes including in October 2023 visit for pelvic pain where she was found to have a hemorrhagic cyst and was discharged with follow-up with Gyn      Physical Exam   Triage Vital Signs: ED Triage Vitals  Enc Vitals Group     BP 04/21/22 0919 (!) 115/90     Pulse Rate 04/21/22 0919 68     Resp 04/21/22 0919 18     Temp 04/21/22 0919 98.4 F (36.9 C)     Temp Source 04/21/22 0919 Oral     SpO2 04/21/22 0919 98 %     Weight 04/21/22 0920 280 lb (127 kg)     Height 04/21/22 0920 '5\' 2"'$  (1.575 m)     Head Circumference --      Peak Flow --      Pain Score 04/21/22 0926 5     Pain Loc --      Pain Edu? --      Excl. in [place]? --     Most recent vital signs: Vitals:   04/21/22 0919  BP: (!) 115/90  Pulse: 68  Resp: 18  Temp: 98.4 F (36.9 C)  SpO2: 98%    General: Awake, no distress.  CV:  Good peripheral perfusion.  Resp:  Normal effort.  Abd:  No distention.  Other:  Soft abdomen without rigidity or guarding.  She states that the pain is in the right lower quadrant, some very mild tenderness with palpation.   ED Results / Procedures / Treatments   Labs (all labs ordered are listed, but only abnormal results are displayed) Labs Reviewed  COMPREHENSIVE METABOLIC PANEL -  Abnormal; Notable for the following components:      Result Value   Glucose, Bld 107 (*)    Calcium 8.6 (*)    All other components within normal limits  CBC - Abnormal; Notable for the following components:   WBC 3.3 (*)    Platelets 515 (*)    All other components within normal limits  LIPASE, BLOOD  URINALYSIS, ROUTINE W REFLEX MICROSCOPIC  POC URINE PREG, ED     I reviewed labs and they are notable for white blood cell count 3.3.   RADIOLOGY I independently reviewed and interpreted trans vaginal ultrasound and see no obvious intrauterine pregnancy.   PROCEDURES:  Critical Care performed: No  Procedures   MEDICATIONS ORDERED IN ED: Medications - No data to display  Consultants:  IMPRESSION / MDM / [place] / ED COURSE  I reviewed the triage vital signs and the nursing notes.                              Differential diagnosis  includes, but is not limited to, ovarian cyst, ovarian torsion, TOA or other pelvic infection, ectopic pregnancy or other pregnancy related pathology, endometriosis, considered but less likely appendicitis.    MDM: Benign abdominal exam and known cyst and endometriosis with subacute pain.  No fever elevated white blood cell count, appears nontoxic and well-appearing.  I doubt surgical abdominal pathology like appendicitis cholecystitis and will defer CT scan at this point after discussion of risk and benefit with patient.  Will obtain a transvaginal ultrasound to rule out torsion in the setting of known cyst on the right ovary.   If ultrasound is negative for torsion, I think this pain is most likely due to cyst or endometriosis and highly unlikely to be surgical abdominal pathologies at this time.  I considered observation for serial abdominal exams but given hemodynamic stability and normal blood work, I discussed with patient and we both agree that a staged discharge approach at this time with close at home monitoring of symptoms  would be most appropriate.  She will return to the emergency department with any new or worsening symptoms.    Ultrasound shows left cyst but normal-appearing right ovary.  Good flow no obvious signs of torsion.  Patient is comfortable with some remaining right-sided discomfort abdomen remains benign to examination at this time.  I reemphasized that we may obtain a CAT scan today or close observation as discussed above.  Patient opts for deferring CT scan at this time and will keep a close eye on her symptoms at home and return to the emergency department if symptoms progress  . Patient's presentation is most consistent with acute presentation with potential threat to life or bodily function.       FINAL CLINICAL IMPRESSION(S) / ED DIAGNOSES   Final diagnoses:  None     Rx / DC Orders   ED Discharge Orders     None        Note:  This document was prepared using Dragon voice recognition software and may include unintentional dictation errors.    Lucillie Garfinkel, MD 04/21/22 [phone]

## 2022-04-27 ENCOUNTER — Telehealth: Payer: Self-pay

## 2022-04-27 NOTE — Telephone Encounter (Signed)
Called pt to confirm appointment for tomorrow. Left message for pt to call and confirm appointment by 1500 04/28/22

## 2022-04-29 ENCOUNTER — Encounter: Payer: Self-pay | Admitting: Family Medicine

## 2022-04-29 ENCOUNTER — Ambulatory Visit (INDEPENDENT_AMBULATORY_CARE_PROVIDER_SITE_OTHER): Payer: Medicaid Other | Admitting: Family Medicine

## 2022-04-29 VITALS — BP 124/85 | HR 86 | Ht 62.0 in | Wt 288.0 lb

## 2022-04-29 DIAGNOSIS — Z319 Encounter for procreative management, unspecified: Secondary | ICD-10-CM | POA: Insufficient documentation

## 2022-04-29 DIAGNOSIS — N809 Endometriosis, unspecified: Secondary | ICD-10-CM | POA: Diagnosis not present

## 2022-04-29 DIAGNOSIS — N83201 Unspecified ovarian cyst, right side: Secondary | ICD-10-CM

## 2022-04-29 DIAGNOSIS — Z6841 Body Mass Index (BMI) 40.0 and over, adult: Secondary | ICD-10-CM

## 2022-04-29 MED ORDER — NORETHINDRONE ACETATE 5 MG PO TABS: 5.0000 mg | ORAL_TABLET | Freq: Every day | ORAL | 1 refills | Status: DC

## 2022-04-29 NOTE — Progress Notes (Signed)
   Subjective:    Patient ID: Dawn Walton is a 27 y.o. female presenting with Gynecologic Exam  on 04/29/2022  HPI: Has endometriosis noted on laparoscopy in 2019. Cycles are regular and monthly. Cycles are not as painful as prior to surgery. Has cysts noted on right and then on left as well. These seem to be there but then resolve.  Still having pain from the left side and notes pain continues. Seems to be some better. Recommendation to f/u in 6-12 weeks. Has been trying to achieve pregnancy since end of 2016. She has had 2 SABs. Partner without kids. Has very regular cycles.   Review of Systems  Constitutional:  Negative for chills and fever.  Respiratory:  Negative for shortness of breath.   Cardiovascular:  Negative for chest pain.  Gastrointestinal:  Negative for abdominal pain, nausea and vomiting.  Genitourinary:  Negative for dysuria.  Skin:  Negative for rash.      Objective:    BP 124/85   Pulse 86   Ht '5\' 2"'$  (1.575 m)   Wt 288 lb (130.6 kg)   LMP 04/02/2022   BMI 52.68 kg/m  Physical Exam Exam conducted with a chaperone present.  Constitutional:      General: She is not in acute distress.    Appearance: She is well-developed.  HENT:     Head: Normocephalic and atraumatic.  Eyes:     General: No scleral icterus. Cardiovascular:     Rate and Rhythm: Normal rate.  Pulmonary:     Effort: Pulmonary effort is normal.  Abdominal:     Palpations: Abdomen is soft.  Musculoskeletal:     Cervical back: Neck supple.  Skin:    General: Skin is warm and dry.  Neurological:     Mental Status: She is alert and oriented to person, place, and time.         Assessment & Plan:   Problem List Items Addressed This Visit       Unprioritized   Obesity    Discussed lifestyle interventions, possible referral to healthy weight and wellness.      Infertility management - Primary    Check labs. Check HSG Check semen analysis. Usual causes reviewed and  treatments. May need REI referral.      Relevant Orders   DG Hysterogram (HSG)   TSH   Hemoglobin A1c   Right ovarian cyst    F/u u/s in 8 weeks--usual management discussed.      Relevant Orders   US PELVIC COMPLETE WITH TRANSVAGINAL   Endometriosis    Trial of Aygestin during cycles. May need repeat surgery--if so, would prefer REI referral--if tubes are also blocked, could fix both, but will not take medicaid. If tubes are open, could consider MIGS referral.      Relevant Medications   norethindrone (AYGESTIN) 5 MG tablet    Return in about 2 months (around 06/28/2022) for a follow-up, needs U/S, schedule HSG next Wednesday.  Donnamae Jude, MD 04/29/2022 2:38 PM

## 2022-04-29 NOTE — Assessment & Plan Note (Signed)
Trial of Aygestin during cycles. May need repeat surgery--if so, would prefer REI referral--if tubes are also blocked, could fix both, but will not take medicaid. If tubes are open, could consider MIGS referral.

## 2022-04-29 NOTE — Assessment & Plan Note (Signed)
F/u u/s in 8 weeks--usual management discussed.

## 2022-04-29 NOTE — Assessment & Plan Note (Signed)
Discussed lifestyle interventions, possible referral to healthy weight and wellness.

## 2022-04-29 NOTE — Assessment & Plan Note (Addendum)
Check labs. Check HSG Check semen analysis. Usual causes reviewed and treatments. May need REI referral.

## 2022-04-29 NOTE — Progress Notes (Signed)
Patient here to discuss Endometriosis, Cyst and fertility per Appt note.  LMP:04/02/22 Lasted 4 days monthly with Moderate flow Last pap:2019 WNL pt will schedule a different day for pap.  Contraception:None , None desired  STD Screening: Declines.  Flu Vaccine : Declines.  CC: Pain has increased since 2019 surgery, pt notes having recurrent ovarian cyst.  Pain is 5/10x. Does Epsom salt baths and Raspberry Tea for discomfort.  Pt has been trying x 8 yrs now to have a baby. Has not done SA yet.     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

## 2022-04-30 LAB — TSH: TSH: 2.62 u[IU]/mL (ref 0.450–4.500)

## 2022-04-30 LAB — HEMOGLOBIN A1C
Est. average glucose Bld gHb Est-mCnc: 111 mg/dL
Hgb A1c MFr Bld: 5.5 % (ref 4.8–5.6)

## 2022-05-05 ENCOUNTER — Ambulatory Visit: Payer: Medicaid Other

## 2022-05-05 ENCOUNTER — Other Ambulatory Visit: Payer: Medicaid Other

## 2022-06-24 ENCOUNTER — Ambulatory Visit: Payer: Medicaid Other | Admitting: Obstetrics and Gynecology

## 2022-12-04 ENCOUNTER — Emergency Department (HOSPITAL_COMMUNITY)
Admission: EM | Admit: 2022-12-04 | Discharge: 2022-12-05 | Disposition: A | Discharge status: Home / Self Care | Payer: Medicaid Other | Attending: Emergency Medicine | Admitting: Emergency Medicine

## 2022-12-04 ENCOUNTER — Encounter (HOSPITAL_COMMUNITY): Payer: Self-pay

## 2022-12-04 ENCOUNTER — Other Ambulatory Visit: Payer: Self-pay

## 2022-12-04 DIAGNOSIS — R1032 Left lower quadrant pain: Secondary | ICD-10-CM | POA: Diagnosis not present

## 2022-12-04 DIAGNOSIS — Z91040 Latex allergy status: Secondary | ICD-10-CM | POA: Diagnosis not present

## 2022-12-04 DIAGNOSIS — J45909 Unspecified asthma, uncomplicated: Secondary | ICD-10-CM | POA: Diagnosis not present

## 2022-12-04 LAB — CBC WITH DIFFERENTIAL/PLATELET
Abs Immature Granulocytes: 0.01 10*3/uL (ref 0.00–0.07)
Basophils Absolute: 0 10*3/uL (ref 0.0–0.1)
Basophils Relative: 0 %
Eosinophils Absolute: 0.1 10*3/uL (ref 0.0–0.5)
Eosinophils Relative: 1 %
HCT: 39.9 % (ref 36.0–46.0)
Hemoglobin: 12.9 g/dL (ref 12.0–15.0)
Immature Granulocytes: 0 %
Lymphocytes Relative: 43 %
Lymphs Abs: 2 10*3/uL (ref 0.7–4.0)
MCH: 28.8 pg (ref 26.0–34.0)
MCHC: 32.3 g/dL (ref 30.0–36.0)
MCV: 89.1 fL (ref 80.0–100.0)
Monocytes Absolute: 0.4 10*3/uL (ref 0.1–1.0)
Monocytes Relative: 9 %
Neutro Abs: 2.1 10*3/uL (ref 1.7–7.7)
Neutrophils Relative %: 47 %
Platelets: 555 10*3/uL — ABNORMAL HIGH (ref 150–400)
RBC: 4.48 MIL/uL (ref 3.87–5.11)
RDW: 12.7 % (ref 11.5–15.5)
WBC: 4.6 10*3/uL (ref 4.0–10.5)
nRBC: 0 % (ref 0.0–0.2)

## 2022-12-04 LAB — URINALYSIS, MICROSCOPIC (REFLEX)

## 2022-12-04 LAB — LIPASE, BLOOD: Lipase: 41 U/L (ref 11–51)

## 2022-12-04 LAB — COMPREHENSIVE METABOLIC PANEL
ALT: 14 U/L (ref 0–44)
AST: 13 U/L — ABNORMAL LOW (ref 15–41)
Albumin: 3.9 g/dL (ref 3.5–5.0)
Alkaline Phosphatase: 67 U/L (ref 38–126)
Anion gap: 9 (ref 5–15)
BUN: 10 mg/dL (ref 6–20)
CO2: 23 mmol/L (ref 22–32)
Calcium: 8.4 mg/dL — ABNORMAL LOW (ref 8.9–10.3)
Chloride: 100 mmol/L (ref 98–111)
Creatinine, Ser: 0.65 mg/dL (ref 0.44–1.00)
GFR, Estimated: >60 mL/min (ref >60)
Glucose, Bld: 103 mg/dL — ABNORMAL HIGH (ref 70–99)
Potassium: 3.2 mmol/L — ABNORMAL LOW (ref 3.5–5.1)
Sodium: 132 mmol/L — ABNORMAL LOW (ref 135–145)
Total Bilirubin: 0.3 mg/dL (ref 0.3–1.2)
Total Protein: 7.3 g/dL (ref 6.5–8.1)

## 2022-12-04 LAB — URINALYSIS, ROUTINE W REFLEX MICROSCOPIC
Bilirubin Urine: NEGATIVE
Glucose, UA: NEGATIVE mg/dL
Hgb urine dipstick: NEGATIVE
Nitrite: NEGATIVE
Protein, ur: NEGATIVE mg/dL
Specific Gravity, Urine: >1.03 — ABNORMAL HIGH (ref 1.005–1.030)
pH: 6 (ref 5.0–8.0)

## 2022-12-04 LAB — HCG, SERUM, QUALITATIVE: Preg, Serum: NEGATIVE

## 2022-12-04 MED ORDER — KETOROLAC TROMETHAMINE 30 MG/ML IJ SOLN
30.0000 mg | Freq: Once | INTRAMUSCULAR | Status: DC
  Filled 2022-12-04: qty 1

## 2022-12-04 MED ORDER — MELOXICAM 7.5 MG PO TABS: 7.5000 mg | ORAL_TABLET | Freq: Every day | ORAL | 0 refills | Status: AC

## 2022-12-04 MED ORDER — KETOROLAC TROMETHAMINE 60 MG/2ML IM SOLN
30.0000 mg | Freq: Once | INTRAMUSCULAR | Status: AC
  Administered 2022-12-05: 30 mg via INTRAMUSCULAR

## 2022-12-04 NOTE — ED Notes (Signed)
Pt states that she doesn't want an IV. MD made aware.

## 2022-12-04 NOTE — ED Triage Notes (Signed)
LLQ abdominal pain, nausea and spotting reported x 4 days.

## 2022-12-04 NOTE — ED Provider Notes (Signed)
[place] EMERGENCY DEPARTMENT AT [facility] Provider Note  CSN: [id number] Arrival date & time: 12/04/22 2047  Chief Complaint(s) Abdominal Pain  HPI [name] is a 27 y.o. female with a past medical history listed below including endometriosis and PCOS with known left ovarian hemorrhagic cyst here for several days of left lower quadrant abdominal pain is constant but fluctuating in nature.  Similar to her prior pains related to endometriosis.  Patient has been taking over-the-counter Motrin and Aleve with minimal relief.  Denies any nausea or vomiting.  No dysuria.  No diarrhea.  No fevers.  Last menstrual period was 2 weeks ago.  The history is provided by the patient.    Past Medical History Past Medical History:  Diagnosis Date   Allergy    Anxiety    Asthma    unclear per pt   Endometriosis    GERD (gastroesophageal reflux disease)    Headache    MIGRAINES OCCASIONALLY   Obesity    UTI (urinary tract infection)    Patient Active Problem List   Diagnosis Date Noted   Infertility management 04/29/2022   Right ovarian cyst 04/29/2022   Endometriosis 04/29/2022   Suprapubic pain 03/21/2014   Leukopenia 07/03/2013   Obesity 05/04/2013   Home Medication(s) Prior to Admission medications   Medication Sig Start Date End Date Taking? Authorizing Provider  meloxicam (MOBIC) 7.5 MG tablet Take 1 tablet (7.5 mg total) by mouth daily for 10 days. 12/04/22 12/14/22 Yes Shlomie Romig, Amadeo Garnet, MD  albuterol (PROVENTIL HFA;VENTOLIN HFA) 108 (90 BASE) MCG/ACT inhaler Inhale into the lungs every 6 (six) hours as needed for wheezing or shortness of breath.    [provider]  norethindrone (AYGESTIN) 5 MG tablet Take 1 tablet (5 mg total) by mouth daily. Take during cycle only 04/29/22   Reva Bores, MD                                                                                                                                    Allergies Apple juice,  Carrot [daucus carota], Celery oil, Tomato, and Latex  Review of Systems Review of Systems As noted in HPI  Physical Exam Vital Signs  I have reviewed the triage vital signs BP (!) 116/99   Pulse 89   Temp 99.2 F (37.3 C)   Resp 18   Ht 5\' 2"  (1.575 m)   Wt 129.3 kg   LMP  (LMP Unknown)   SpO2 99%   BMI 52.13 kg/m    Physical Exam Vitals reviewed.  Constitutional:      General: She is not in acute distress.    Appearance: She is well-developed. She is obese. She is not diaphoretic.  HENT:     Head: Normocephalic and atraumatic.     Right Ear: External ear normal.     Left Ear: External ear normal.     Nose: Nose normal.  Eyes:     General: No scleral icterus.    Conjunctiva/sclera: Conjunctivae normal.  Neck:     Trachea: Phonation normal.  Cardiovascular:     Rate and Rhythm: Normal rate and regular rhythm.  Pulmonary:     Effort: Pulmonary effort is normal. No respiratory distress.     Breath sounds: No stridor.  Abdominal:     General: There is no distension.     Tenderness: There is abdominal tenderness in the left lower quadrant.  Musculoskeletal:        General: Normal range of motion.     Cervical back: Normal range of motion.  Neurological:     Mental Status: She is alert and oriented to person, place, and time.  Psychiatric:        Behavior: Behavior normal.     ED Results and Treatments Labs (all labs ordered are listed, but only abnormal results are displayed) Labs Reviewed  COMPREHENSIVE METABOLIC PANEL - Abnormal; Notable for the following components:      Result Value   Sodium 132 (*)    Potassium 3.2 (*)    Glucose, Bld 103 (*)    Calcium 8.4 (*)    AST 13 (*)    All other components within normal limits  URINALYSIS, ROUTINE W REFLEX MICROSCOPIC - Abnormal; Notable for the following components:   Color, Urine YELLOW (*)    APPearance CLEAR (*)    Specific Gravity, Urine >1.030 (*)    Ketones, ur TRACE (*)    Leukocytes,Ua SMALL  (*)    All other components within normal limits  CBC WITH DIFFERENTIAL/PLATELET - Abnormal; Notable for the following components:   Platelets 555 (*)    All other components within normal limits  URINALYSIS, MICROSCOPIC (REFLEX) - Abnormal; Notable for the following components:   Bacteria, UA FEW (*)    All other components within normal limits  LIPASE, BLOOD  HCG, SERUM, QUALITATIVE                                                                                                                         EKG  EKG Interpretation Date/Time:    Ventricular Rate:    PR Interval:    QRS Duration:    QT Interval:    QTC Calculation:   R Axis:      Text Interpretation:         Radiology No results found.  Medications Ordered in ED Medications  ketorolac (TORADOL) injection 30 mg (has no administration in time range)   Procedures Procedures  (including critical care time) Medical Decision Making / ED Course   Medical Decision Making Amount and/or Complexity of Data Reviewed Labs: ordered. Decision-making details documented in ED Course.  Risk Prescription drug management.    Left lower quadrant abdominal pain Similar to prior presentations for endometriosis/hemorrhagic cyst.  Differential includes exacerbation of the above.  Will assess for urinary tract infection.  Possible diverticulitis though less likely.  Will rule  out pregnancy related process.  CBC without leukocytosis or anemia.   CMP with mild hyponatremia and hypokalemia.  No other significant electrolyte derangements or renal sufficiency.  No evidence of bili obstruction or pancreatitis.  Beta-hCG negative UA not concerning for infection.  Presentation is not concerning for ovarian torsion that would require ultrasound at this time.  Low suspicion for serious intra-abdominal inflammatory/infectious process given her reassuring labs.  No need for CT imaging at this time.  IM Toradol given Will Rx  Mobic Recommend OB/GYN follow-up.     Final Clinical Impression(s) / ED Diagnoses Final diagnoses:  LLQ pain    This chart was dictated using voice recognition software.  Despite best efforts to proofread,  errors can occur which can change the documentation meaning.    Nira Conn, MD 12/05/22 0000

## 2024-01-24 ENCOUNTER — Encounter: Payer: Self-pay | Admitting: Emergency Medicine

## 2024-01-24 ENCOUNTER — Other Ambulatory Visit: Payer: Self-pay

## 2024-01-24 DIAGNOSIS — R1032 Left lower quadrant pain: Secondary | ICD-10-CM | POA: Insufficient documentation

## 2024-01-24 DIAGNOSIS — J45909 Unspecified asthma, uncomplicated: Secondary | ICD-10-CM | POA: Insufficient documentation

## 2024-01-24 DIAGNOSIS — E876 Hypokalemia: Secondary | ICD-10-CM | POA: Diagnosis not present

## 2024-01-24 LAB — CBC
HCT: 35.9 % — ABNORMAL LOW (ref 36.0–46.0)
Hemoglobin: 11.6 g/dL — ABNORMAL LOW (ref 12.0–15.0)
MCH: 28.6 pg (ref 26.0–34.0)
MCHC: 32.3 g/dL (ref 30.0–36.0)
MCV: 88.4 fL (ref 80.0–100.0)
Platelets: 568 K/uL — ABNORMAL HIGH (ref 150–400)
RBC: 4.06 MIL/uL (ref 3.87–5.11)
RDW: 12.9 % (ref 11.5–15.5)
WBC: 6 K/uL (ref 4.0–10.5)
nRBC: 0 % (ref 0.0–0.2)

## 2024-01-24 LAB — URINALYSIS, ROUTINE W REFLEX MICROSCOPIC
Bacteria, UA: NONE SEEN
Bilirubin Urine: NEGATIVE
Glucose, UA: NEGATIVE mg/dL
Hgb urine dipstick: NEGATIVE
Ketones, ur: 20 mg/dL — AB
Nitrite: NEGATIVE
Protein, ur: NEGATIVE mg/dL
Specific Gravity, Urine: 1.024 (ref 1.005–1.030)
pH: 6 (ref 5.0–8.0)

## 2024-01-24 LAB — COMPREHENSIVE METABOLIC PANEL WITH GFR
ALT: 13 U/L (ref 0–44)
AST: 15 U/L (ref 15–41)
Albumin: 3.8 g/dL (ref 3.5–5.0)
Alkaline Phosphatase: 60 U/L (ref 38–126)
Anion gap: 9 (ref 5–15)
BUN: 11 mg/dL (ref 6–20)
CO2: 24 mmol/L (ref 22–32)
Calcium: 8.9 mg/dL (ref 8.9–10.3)
Chloride: 106 mmol/L (ref 98–111)
Creatinine, Ser: 0.72 mg/dL (ref 0.44–1.00)
GFR, Estimated: >60 mL/min (ref >60)
Glucose, Bld: 87 mg/dL (ref 70–99)
Potassium: 3.2 mmol/L — ABNORMAL LOW (ref 3.5–5.1)
Sodium: 139 mmol/L (ref 135–145)
Total Bilirubin: 0.3 mg/dL (ref 0.0–1.2)
Total Protein: 7 g/dL (ref 6.5–8.1)

## 2024-01-24 LAB — LIPASE, BLOOD: Lipase: 39 U/L (ref 11–51)

## 2024-01-24 LAB — POC URINE PREG, ED: Preg Test, Ur: NEGATIVE

## 2024-01-24 NOTE — ED Triage Notes (Signed)
 Pt arrives POV, ambulatory to triage, gait steady, no acute distress noted c/o LLQ pain w/ nausea x 2 days. LMP 01/08/24. Denies difficulty urinating . Last BM this morning and it was normal.

## 2024-01-25 ENCOUNTER — Emergency Department
Admission: EM | Admit: 2024-01-25 | Discharge: 2024-01-25 | Disposition: A | Discharge status: Home / Self Care | Attending: Emergency Medicine | Admitting: Emergency Medicine

## 2024-01-25 DIAGNOSIS — R102 Pelvic and perineal pain unspecified side: Secondary | ICD-10-CM

## 2024-01-25 MED ORDER — HYDROCODONE-ACETAMINOPHEN 5-325 MG PO TABS
1.0000 | ORAL_TABLET | Freq: Once | ORAL | Status: AC
  Administered 2024-01-25: 1 via ORAL
  Filled 2024-01-25: qty 1

## 2024-01-25 MED ORDER — IBUPROFEN 800 MG PO TABS
800.0000 mg | ORAL_TABLET | Freq: Once | ORAL | Status: AC
  Administered 2024-01-25: 800 mg via ORAL
  Filled 2024-01-25: qty 1

## 2024-01-25 MED ORDER — ONDANSETRON HCL 4 MG/2ML IJ SOLN: 4.0000 mg | Freq: Once | INTRAMUSCULAR | Status: DC

## 2024-01-25 MED ORDER — ONDANSETRON 4 MG PO TBDP
4.0000 mg | ORAL_TABLET | Freq: Once | ORAL | Status: AC
  Administered 2024-01-25: 4 mg via ORAL
  Filled 2024-01-25: qty 1

## 2024-01-25 MED ORDER — KETOROLAC TROMETHAMINE 30 MG/ML IJ SOLN: 30.0000 mg | Freq: Once | INTRAMUSCULAR | Status: DC

## 2024-01-25 NOTE — ED Notes (Signed)
Pt refusing IV, provider notified.

## 2024-01-25 NOTE — Discharge Instructions (Signed)
 You may alternate over the counter Tylenol  1000 mg every 6 hours as needed for pain, fever and Ibuprofen  800 mg every 6-8 hours as needed for pain, fever.  Please take Ibuprofen  with food.  Do not take more than 4000 mg of Tylenol  (acetaminophen ) in a 24 hour period.  We have recommended an ultrasound of your pelvic organs today.  You have decided to leave without further workup.  Please return if symptoms continue or worsen.

## 2024-01-25 NOTE — ED Provider Notes (Signed)
 [facility] Provider Note    Event Date/Time   First MD Initiated Contact with Patient 01/25/24 0012     (approximate)   History   Abdominal Pain   HPI  Dawn Walton is a 28 y.o. female with history of asthma, endometriosis, ovarian cysts, GERD, obesity who presents to the emergency department with left lower pelvic pain, nausea that started the past couple of days.  Does have a history of endometriosis and has had previous laparoscopic surgery for ablation.  States this cyst does feel similar to her endometriosis.  Denies any concern for STIs.  No vaginal bleeding, discharge, dysuria or hematuria.   History provided by patient, significant other.    Past Medical History:  Diagnosis Date   Allergy    Anxiety    Asthma    unclear per pt   Endometriosis    GERD (gastroesophageal reflux disease)    Headache    MIGRAINES OCCASIONALLY   Obesity    UTI (urinary tract infection)     Past Surgical History:  Procedure Laterality Date   LAPAROSCOPY N/A 08/27/2017   Procedure: LAPAROSCOPY OPERATIVE; ABLATION OF ENDOMETRIAL IMPLANTS; STRIPPING OF PERITONEUM;  Surgeon: Starla Deller, Mitzie BROCKS, MD;  Location: [facility] ORS;  Service: Gynecology;  Laterality: N/A;   NO PAST SURGERIES      MEDICATIONS:  Prior to Admission medications   Medication Sig Start Date End Date Taking? Authorizing Provider  albuterol  (PROVENTIL  HFA;VENTOLIN  HFA) 108 (90 BASE) MCG/ACT inhaler Inhale into the lungs every 6 (six) hours as needed for wheezing or shortness of breath.    [provider]  norethindrone  (AYGESTIN ) 5 MG tablet Take 1 tablet (5 mg total) by mouth daily. Take during cycle only 04/29/22   Fredirick Glenys RAMAN, MD    Physical Exam   Triage Vital Signs: ED Triage Vitals  Encounter Vitals Group     BP 01/24/24 2120 131/81     Girls Systolic BP Percentile --      Girls Diastolic BP Percentile --      Boys Systolic BP Percentile --      Boys Diastolic BP  Percentile --      Pulse Rate 01/24/24 2120 75     Resp 01/24/24 2120 18     Temp 01/24/24 2120 98.2 F (36.8 C)     Temp Source 01/24/24 2120 Oral     SpO2 01/24/24 2120 100 %     Weight 01/24/24 2126 280 lb (127 kg)     Height 01/24/24 2126 5' 2 (1.575 m)     Head Circumference --      Peak Flow --      Pain Score 01/24/24 2126 7     Pain Loc --      Pain Education --      Exclude from Growth Chart --     Most recent vital signs: Vitals:   01/25/24 0022 01/25/24 0157  BP: 125/82 121/79  Pulse: 70 71  Resp: 18 15  Temp: 98.1 F (36.7 C) 98 F (36.7 C)  SpO2: 98% 99%    CONSTITUTIONAL: Alert, responds appropriately to questions. Well-appearing; well-nourished HEAD: Normocephalic, atraumatic EYES: Conjunctivae clear, pupils appear equal, sclera nonicteric ENT: normal nose; moist mucous membranes NECK: Supple, normal ROM CARD: RRR; S1 and S2 appreciated RESP: Normal chest excursion without splinting or tachypnea; breath sounds clear and equal bilaterally; no wheezes, no rhonchi, no rales, no hypoxia or respiratory distress, speaking full sentences ABD/GI: Non-distended; soft, tender in  the left pelvic region without guarding or rebound BACK: The back appears normal EXT: Normal ROM in all joints; no deformity noted, no edema SKIN: Normal color for age and race; warm; no rash on exposed skin NEURO: Moves all extremities equally, normal speech PSYCH: The patient's mood and manner are appropriate.   ED Results / Procedures / Treatments   LABS: (all labs ordered are listed, but only abnormal results are displayed) Labs Reviewed  COMPREHENSIVE METABOLIC PANEL WITH GFR - Abnormal; Notable for the following components:      Result Value   Potassium 3.2 (*)    All other components within normal limits  CBC - Abnormal; Notable for the following components:   Hemoglobin 11.6 (*)    HCT 35.9 (*)    Platelets 568 (*)    All other components within normal limits   URINALYSIS, ROUTINE W REFLEX MICROSCOPIC - Abnormal; Notable for the following components:   Color, Urine YELLOW (*)    APPearance HAZY (*)    Ketones, ur 20 (*)    Leukocytes,Ua TRACE (*)    All other components within normal limits  LIPASE, BLOOD  POC URINE PREG, ED     EKG:   RADIOLOGY: My personal review and interpretation of imaging:    I have personally reviewed all radiology reports.   No results found.   PROCEDURES:  Critical Care performed: No     Procedures    IMPRESSION / MDM / ASSESSMENT AND PLAN / ED COURSE  I reviewed the triage vital signs and the nursing notes.    Patient here with left pelvic pain that feels similar to her prior episodes of endometriosis.    DIFFERENTIAL DIAGNOSIS (includes but not limited to):   Endometriosis, ovarian cyst, ovarian torsion, PID, UTI, kidney stone, pyelonephritis, diverticulitis, colitis, doubt appendicitis   Patient's presentation is most consistent with acute presentation with potential threat to life or bodily function.   PLAN: Will obtain labs, urine, transvaginal ultrasound with Doppler.  Will give pain medication.   MEDICATIONS GIVEN IN ED: Medications  HYDROcodone -acetaminophen  (NORCO/VICODIN) 5-325 MG per tablet 1 tablet (1 tablet Oral Given 01/25/24 0139)  ibuprofen  (ADVIL ) tablet 800 mg (800 mg Oral Given 01/25/24 0139)  ondansetron  (ZOFRAN -ODT) disintegrating tablet 4 mg (4 mg Oral Given 01/25/24 0139)     ED COURSE: Patient declines IV pain medication.  Labs show no leukocytosis.  Normal electrolytes, creatinine, LFTs, lipase.  Urine does show small amount of red blood cells but no other sign of infection.  Pregnancy test negative.  Transvaginal ultrasound pending but patient states that she needs to leave because she has to get up for work in the morning.  I did offer her a work note but she states that she will still have to go into work to show them the note.  She does not want to stay  for further evaluation.  She states she will return later today if symptoms continue.  Recommended Tylenol , Motrin  over-the-counter and close OB/GYN follow-up.  She declines any STI screening today.   CONSULTS: Further workup recommended but patient leaving and will follow-up with her outpatient providers and return to the ED later today if symptoms continue.   OUTSIDE RECORDS REVIEWED: Reviewed last OB/GYN note in January 2024.       FINAL CLINICAL IMPRESSION(S) / ED DIAGNOSES   Final diagnoses:  Pelvic pain     Rx / DC Orders   ED Discharge Orders     None  Note:  This document was prepared using Dragon voice recognition software and may include unintentional dictation errors.   Daneshia Tavano, Josette SAILOR, DO 01/25/24 [phone]

## 2024-02-01 ENCOUNTER — Encounter (HOSPITAL_COMMUNITY): Payer: Self-pay

## 2024-02-01 ENCOUNTER — Ambulatory Visit (INDEPENDENT_AMBULATORY_CARE_PROVIDER_SITE_OTHER)

## 2024-02-01 ENCOUNTER — Ambulatory Visit (HOSPITAL_COMMUNITY)
Admission: RE | Admit: 2024-02-01 | Discharge: 2024-02-01 | Disposition: A | Discharge status: Home / Self Care | Source: Ambulatory Visit | Attending: Internal Medicine | Admitting: Internal Medicine

## 2024-02-01 VITALS — BP 113/76 | HR 89 | Temp 98.4°F | Resp 16

## 2024-02-01 DIAGNOSIS — R0602 Shortness of breath: Secondary | ICD-10-CM

## 2024-02-01 DIAGNOSIS — R062 Wheezing: Secondary | ICD-10-CM | POA: Diagnosis not present

## 2024-02-01 DIAGNOSIS — J069 Acute upper respiratory infection, unspecified: Secondary | ICD-10-CM | POA: Diagnosis not present

## 2024-02-01 DIAGNOSIS — R051 Acute cough: Secondary | ICD-10-CM

## 2024-02-01 LAB — POC COVID19/FLU A&B COMBO
Covid Antigen, POC: NEGATIVE
Influenza A Antigen, POC: NEGATIVE
Influenza B Antigen, POC: NEGATIVE

## 2024-02-01 MED ORDER — ALBUTEROL SULFATE HFA 108 (90 BASE) MCG/ACT IN AERS
1.0000 | INHALATION_SPRAY | Freq: Four times a day (QID) | RESPIRATORY_TRACT | 0 refills | Status: AC | PRN
Start: 2024-02-01 — End: ?

## 2024-02-01 MED ORDER — PROMETHAZINE-DM 6.25-15 MG/5ML PO SYRP: 5.0000 mL | ORAL_SOLUTION | Freq: Three times a day (TID) | ORAL | 0 refills | Status: DC | PRN

## 2024-02-01 MED ORDER — PREDNISONE 20 MG PO TABS: 40.0000 mg | ORAL_TABLET | Freq: Every day | ORAL | 0 refills | Status: AC

## 2024-02-01 NOTE — Discharge Instructions (Addendum)
 X-ray done today.  Final evaluation by the radiologist does not show any signs of acute process.  There is no signs of pneumonia. Flu A, flu B and COVID are negative.  Symptoms are most consistent with a viral infection.  This does not require antibiotic treatment.  We focus treatment on improving the symptoms.  We will treat with the following:  Albuterol  inhaler 1-2 puffs every 6 hours as needed for wheezing/shortness of breath. Prednisone 40 mg (2 tablets) once daily for 5 days. Take this in the morning.  This is a steroid to help with inflammation.  Do not take ibuprofen  while you are taking this medication. May take Tylenol  every 6 hours as needed for pain or fevers Promethazine  DM 5 mL every 8 hours as needed for cough.  Use caution as this medication can cause drowsiness. Make sure to stay hydrated by drinking plenty of water. Return to urgent care or PCP if symptoms worsen or fail to resolve.

## 2024-02-01 NOTE — ED Provider Notes (Signed)
 [facility]    CSN: [id number] Arrival date & time: 02/01/24  1847      History   Chief Complaint Chief Complaint  Patient presents with   Cough    Entered by patient    HPI Dawn Walton is a 28 y.o. female.   28 year old female presents urgent care with complaints of cough, wheezing, shortness of breath, sore throat, chills and bodyaches.  This is also associated with some congestion.  Her symptoms started on Sunday.  She has tried taking DayQuil, herbal teas and NyQuil.  She is not noticed that she is running a fever.  She has not had any known sick contacts.  She works from home so is not usually around a lot of people.  She denies any nausea, vomiting, abdominal pain, headache.  She reports she does normally get sick once a year but does not normally have the amount of wheezing that she has been having.  This was concerning to her.  She does have a history of having to use an inhaler in the past.   Cough Associated symptoms: chills, myalgias, shortness of breath and wheezing   Associated symptoms: no chest pain, no ear pain, no fever, no rash and no sore throat     Past Medical History:  Diagnosis Date   Allergy    Anxiety    Asthma    unclear per pt   Endometriosis    GERD (gastroesophageal reflux disease)    Headache    MIGRAINES OCCASIONALLY   Obesity    UTI (urinary tract infection)     Patient Active Problem List   Diagnosis Date Noted   Infertility management 04/29/2022   Right ovarian cyst 04/29/2022   Endometriosis 04/29/2022   Suprapubic pain 03/21/2014   Leukopenia 07/03/2013   Obesity 05/04/2013    Past Surgical History:  Procedure Laterality Date   LAPAROSCOPY N/A 08/27/2017   Procedure: LAPAROSCOPY OPERATIVE; ABLATION OF ENDOMETRIAL IMPLANTS; STRIPPING OF PERITONEUM;  Surgeon: Neomi Mitzie BROCKS, MD;  Location: [facility];  Service: Gynecology;  Laterality: N/A;   NO PAST SURGERIES      OB History     Gravida  2   Para       Term      Preterm      AB  2   Living  0      SAB      IAB      Ectopic      Multiple      Live Births               Home Medications    Prior to Admission medications   Medication Sig Start Date End Date Taking? Authorizing Provider  albuterol  (VENTOLIN  HFA) 108 (90 Base) MCG/ACT inhaler Inhale 1-2 puffs into the lungs every 6 (six) hours as needed for wheezing or shortness of breath. 02/01/24  Yes Wilder Amodei A, PA-C  predniSONE (DELTASONE) 20 MG tablet Take 2 tablets (40 mg total) by mouth daily with breakfast for 5 days. 02/01/24 02/06/24 Yes Eriyonna Matsushita A, PA-C  promethazine -dextromethorphan (PROMETHAZINE -DM) 6.25-15 MG/5ML syrup Take 5 mLs by mouth every 8 (eight) hours as needed for cough. 02/01/24  Yes Teresa Almarie LABOR, PA-C    Family History Family History  Problem Relation Age of Onset   Hypertension Mother    Thyroid  disease Mother    Hyperlipidemia Maternal Grandmother    Hypertension Maternal Grandmother    Diabetes Maternal Grandfather    Heart disease  Maternal Grandfather    Stroke Maternal Grandfather     Social History Social History   Tobacco Use   Smoking status: Never   Smokeless tobacco: Never  Vaping Use   Vaping status: Never Used  Substance Use Topics   Alcohol use: Yes    Alcohol/week: 0.0 standard drinks of alcohol    Comment: occ   Drug use: No     Allergies   Apple juice, Carrot [daucus carota], Celery oil, Tomato, and Latex   Review of Systems Review of Systems  Constitutional:  Positive for chills and fatigue. Negative for fever.  HENT:  Positive for congestion. Negative for ear pain and sore throat.   Eyes:  Negative for pain and visual disturbance.  Respiratory:  Positive for cough, shortness of breath and wheezing.   Cardiovascular:  Negative for chest pain and palpitations.  Gastrointestinal:  Negative for abdominal pain, nausea and vomiting.  Genitourinary:  Negative for dysuria and hematuria.   Musculoskeletal:  Positive for myalgias. Negative for arthralgias and back pain.  Skin:  Negative for color change and rash.  Neurological:  Negative for seizures and syncope.  All other systems reviewed and are negative.    Physical Exam Triage Vital Signs ED Triage Vitals [02/01/24 1902]  Encounter Vitals Group     BP 113/76     Girls Systolic BP Percentile      Girls Diastolic BP Percentile      Boys Systolic BP Percentile      Boys Diastolic BP Percentile      Pulse Rate 89     Resp 16     Temp 98.4 F (36.9 C)     Temp Source Oral     SpO2 96 %     Weight      Height      Head Circumference      Peak Flow      Pain Score 0     Pain Loc      Pain Education      Exclude from Growth Chart    No data found.  Updated Vital Signs BP 113/76 (BP Location: Left Arm)   Pulse 89   Temp 98.4 F (36.9 C) (Oral)   Resp 16   LMP 01/08/2024 (Exact Date)   SpO2 96%   Breastfeeding No   Visual Acuity Right Eye Distance:   Left Eye Distance:   Bilateral Distance:    Right Eye Near:   Left Eye Near:    Bilateral Near:     Physical Exam Vitals and nursing note reviewed.  Constitutional:      General: She is not in acute distress.    Appearance: She is well-developed.  HENT:     Head: Normocephalic and atraumatic.  Eyes:     Conjunctiva/sclera: Conjunctivae normal.  Cardiovascular:     Rate and Rhythm: Normal rate and regular rhythm.     Heart sounds: No murmur heard. Pulmonary:     Effort: Pulmonary effort is normal. No tachypnea or respiratory distress.     Breath sounds: Examination of the right-lower field reveals decreased breath sounds and wheezing. Examination of the left-lower field reveals decreased breath sounds and wheezing. Decreased breath sounds and wheezing present. No rhonchi.  Abdominal:     Palpations: Abdomen is soft.     Tenderness: There is no abdominal tenderness.  Musculoskeletal:        General: No swelling.     Cervical back: Neck  supple.  Skin:  General: Skin is warm and dry.     Capillary Refill: Capillary refill takes less than 2 seconds.  Neurological:     Mental Status: She is alert.  Psychiatric:        Mood and Affect: Mood normal.      UC Treatments / Results  Labs (all labs ordered are listed, but only abnormal results are displayed) Labs Reviewed  POC COVID19/FLU A&B COMBO    EKG   Radiology DG Chest 2 View Result Date: 02/01/2024 EXAM: 2 VIEW(S) XRAY OF THE CHEST 02/01/2024 07:29:14 PM COMPARISON: Chest x-ray 10/25/2014. CLINICAL HISTORY: Cough, wheezing, shortness of breath. Patient here today with c/o cough, wheeze, SOB, ST, chills, sweats, body aches, and nasal congestion since Sunday. She has tried taking Dayquil and Nyquil with some relief. No known sick contacts. No recent travel. FINDINGS: LUNGS AND PLEURA: No focal pulmonary opacity. No pulmonary edema. No pleural effusion. No pneumothorax. HEART AND MEDIASTINUM: No acute abnormality of the cardiac and mediastinal silhouettes. BONES AND SOFT TISSUES: No acute osseous abnormality. IMPRESSION: 1. No acute process. Electronically signed by: Greig Pique MD 02/01/2024 07:37 PM EDT RP Workstation: [id number]    Procedures Procedures (including critical care time)  Medications Ordered in UC Medications - No data to display  Initial Impression / Assessment and Plan / UC Course  I have reviewed the triage vital signs and the nursing notes.  Pertinent labs & imaging results that were available during my care of the patient were reviewed by me and considered in my medical decision making (see chart for details).     Acute cough - Plan: POC Covid19/Flu A&B Antigen, DG Chest 2 View, POC Covid19/Flu A&B Antigen, DG Chest 2 View  Shortness of breath - Plan: POC Covid19/Flu A&B Antigen, DG Chest 2 View, POC Covid19/Flu A&B Antigen, DG Chest 2 View  Wheezing - Plan: POC Covid19/Flu A&B Antigen, DG Chest 2 View, POC Covid19/Flu A&B Antigen, DG  Chest 2 View  Viral upper respiratory tract infection with cough   X-ray done today.  Final evaluation by the radiologist does not show any signs of acute process.  There is no signs of pneumonia. Flu A, flu B and COVID are negative.  Symptoms are most consistent with a viral infection.  This does not require antibiotic treatment.  We focus treatment on improving the symptoms.  We will treat with the following:  Albuterol  inhaler 1-2 puffs every 6 hours as needed for wheezing/shortness of breath. Prednisone 40 mg (2 tablets) once daily for 5 days. Take this in the morning.  This is a steroid to help with inflammation.  Do not take ibuprofen  while you are taking this medication. May take Tylenol  every 6 hours as needed for pain or fevers Promethazine  DM 5 mL every 8 hours as needed for cough.  Use caution as this medication can cause drowsiness. Make sure to stay hydrated by drinking plenty of water. Return to urgent care or PCP if symptoms worsen or fail to resolve.    Final Clinical Impressions(s) / UC Diagnoses   Final diagnoses:  Acute cough  Shortness of breath  Wheezing  Viral upper respiratory tract infection with cough     Discharge Instructions      X-ray done today.  Final evaluation by the radiologist does not show any signs of acute process.  There is no signs of pneumonia. Flu A, flu B and COVID are negative.  Symptoms are most consistent with a viral infection.  This does not require  antibiotic treatment.  We focus treatment on improving the symptoms.  We will treat with the following:  Albuterol  inhaler 1-2 puffs every 6 hours as needed for wheezing/shortness of breath. Prednisone 40 mg (2 tablets) once daily for 5 days. Take this in the morning.  This is a steroid to help with inflammation.  Do not take ibuprofen  while you are taking this medication. May take Tylenol  every 6 hours as needed for pain or fevers Promethazine  DM 5 mL every 8 hours as needed for cough.  Use  caution as this medication can cause drowsiness. Make sure to stay hydrated by drinking plenty of water. Return to urgent care or PCP if symptoms worsen or fail to resolve.       ED Prescriptions     Medication Sig Dispense Auth. Provider   albuterol  (VENTOLIN  HFA) 108 (90 Base) MCG/ACT inhaler Inhale 1-2 puffs into the lungs every 6 (six) hours as needed for wheezing or shortness of breath. 6.7 g Bennett Vanscyoc A, PA-C   promethazine -dextromethorphan (PROMETHAZINE -DM) 6.25-15 MG/5ML syrup Take 5 mLs by mouth every 8 (eight) hours as needed for cough. 180 mL Sherice Ijames A, PA-C   predniSONE (DELTASONE) 20 MG tablet Take 2 tablets (40 mg total) by mouth daily with breakfast for 5 days. 10 tablet Teresa Almarie LABOR, [place]      PDMP not reviewed this encounter.   Teresa Almarie LABOR DEVONNA 02/01/24 2008

## 2024-02-01 NOTE — ED Triage Notes (Signed)
 Patient here today with c/o cough, wheeze, SOB, ST, chills, sweats, body aches, and nasal congestion since Sunday. She has tried taking Dayquil and Nyquil with some relief. No known sick contacts. No recent travel.

## 2024-02-17 ENCOUNTER — Ambulatory Visit (HOSPITAL_COMMUNITY)
Admission: RE | Admit: 2024-02-17 | Discharge: 2024-02-17 | Disposition: A | Discharge status: Home / Self Care | Source: Ambulatory Visit | Attending: Emergency Medicine | Admitting: Emergency Medicine

## 2024-02-17 ENCOUNTER — Encounter (HOSPITAL_COMMUNITY): Payer: Self-pay

## 2024-02-17 ENCOUNTER — Other Ambulatory Visit: Payer: Self-pay

## 2024-02-17 VITALS — BP 117/79 | HR 87 | Temp 99.4°F | Resp 18

## 2024-02-17 DIAGNOSIS — J329 Chronic sinusitis, unspecified: Secondary | ICD-10-CM | POA: Diagnosis not present

## 2024-02-17 DIAGNOSIS — J40 Bronchitis, not specified as acute or chronic: Secondary | ICD-10-CM | POA: Diagnosis not present

## 2024-02-17 MED ORDER — BENZONATATE 100 MG PO CAPS: 100.0000 mg | ORAL_CAPSULE | Freq: Three times a day (TID) | ORAL | 0 refills | Status: AC

## 2024-02-17 MED ORDER — PROMETHAZINE-DM 6.25-15 MG/5ML PO SYRP: 5.0000 mL | ORAL_SOLUTION | Freq: Four times a day (QID) | ORAL | 0 refills | Status: AC | PRN

## 2024-02-17 MED ORDER — PREDNISONE 20 MG PO TABS: 40.0000 mg | ORAL_TABLET | Freq: Every day | ORAL | 0 refills | Status: AC

## 2024-02-17 MED ORDER — AMOXICILLIN-POT CLAVULANATE 875-125 MG PO TABS: 1.0000 | ORAL_TABLET | Freq: Two times a day (BID) | ORAL | 0 refills | Status: AC

## 2024-02-17 NOTE — ED Triage Notes (Signed)
 PT reports she still has a since last visit on 02/01/24. Pt reports she took the med prescribed for her. Pt now reports nausea and feeling a sore throat.

## 2024-02-17 NOTE — ED Provider Notes (Signed)
 [facility]    CSN: [id number] Arrival date & time: 02/17/24  1536      History   Chief Complaint Chief Complaint  Patient presents with   Cough    Entered by patient    HPI Dawn Walton is a 28 y.o. female.   Patient presents to clinic over concern of continued cough since visit on 02/01/24. Patient took prednisone and Promethazine  DM.  Symptoms improved temporarily but then returned.  Cough is productive and keeping her up at night.  She has now developed a low-grade temp, had a fever of 100 last night.  Has felt fatigued.  Endorses wheezing and shortness of breath as well.  Does have a documented history of asthma.  The history is provided by the patient and medical records.  Cough   Past Medical History:  Diagnosis Date   Allergy    Anxiety    Asthma    unclear per pt   Endometriosis    GERD (gastroesophageal reflux disease)    Headache    MIGRAINES OCCASIONALLY   Obesity    UTI (urinary tract infection)     Patient Active Problem List   Diagnosis Date Noted   Infertility management 04/29/2022   Right ovarian cyst 04/29/2022   Endometriosis 04/29/2022   Suprapubic pain 03/21/2014   Leukopenia 07/03/2013   Obesity 05/04/2013    Past Surgical History:  Procedure Laterality Date   LAPAROSCOPY N/A 08/27/2017   Procedure: LAPAROSCOPY OPERATIVE; ABLATION OF ENDOMETRIAL IMPLANTS; STRIPPING OF PERITONEUM;  Surgeon: Neomi Mitzie BROCKS, MD;  Location: [facility];  Service: Gynecology;  Laterality: N/A;   NO PAST SURGERIES      OB History     Gravida  2   Para      Term      Preterm      AB  2   Living  0      SAB      IAB      Ectopic      Multiple      Live Births               Home Medications    Prior to Admission medications   Medication Sig Start Date End Date Taking? Authorizing Provider  amoxicillin -clavulanate (AUGMENTIN) 875-125 MG tablet Take 1 tablet by mouth every 12 (twelve) hours. 02/17/24  Yes Cray Monnin,  Otelia Hettinger  N, FNP  benzonatate (TESSALON) 100 MG capsule Take 1 capsule (100 mg total) by mouth every 8 (eight) hours. 02/17/24  Yes Hussien Greenblatt  N, FNP  predniSONE (DELTASONE) 20 MG tablet Take 2 tablets (40 mg total) by mouth daily for 5 days. 02/17/24 02/22/24 Yes Ralpheal Zappone  N, FNP  promethazine -dextromethorphan (PROMETHAZINE -DM) 6.25-15 MG/5ML syrup Take 5 mLs by mouth 4 (four) times daily as needed for cough. 02/17/24  Yes Georgenia Salim  N, FNP  albuterol  (VENTOLIN  HFA) 108 (90 Base) MCG/ACT inhaler Inhale 1-2 puffs into the lungs every 6 (six) hours as needed for wheezing or shortness of breath. 02/01/24   Teresa Almarie LABOR, PA-C    Family History Family History  Problem Relation Age of Onset   Hypertension Mother    Thyroid  disease Mother    Hyperlipidemia Maternal Grandmother    Hypertension Maternal Grandmother    Diabetes Maternal Grandfather    Heart disease Maternal Grandfather    Stroke Maternal Grandfather     Social History Social History   Tobacco Use   Smoking status: Never   Smokeless tobacco: Never  Vaping Use  Vaping status: Never Used  Substance Use Topics   Alcohol use: Yes    Alcohol/week: 0.0 standard drinks of alcohol    Comment: occ   Drug use: No     Allergies   Apple juice, Carrot [daucus carota], Celery oil, Tomato, and Latex   Review of Systems Review of Systems  Per HPI  Physical Exam Triage Vital Signs ED Triage Vitals  Encounter Vitals Group     BP 02/17/24 1610 117/79     Girls Systolic BP Percentile --      Girls Diastolic BP Percentile --      Boys Systolic BP Percentile --      Boys Diastolic BP Percentile --      Pulse Rate 02/17/24 1610 87     Resp 02/17/24 1610 18     Temp 02/17/24 1610 99.4 F (37.4 C)     Temp src --      SpO2 02/17/24 1610 99 %     Weight --      Height --      Head Circumference --      Peak Flow --      Pain Score 02/17/24 1608 7     Pain Loc --      Pain Education --       Exclude from Growth Chart --    No data found.  Updated Vital Signs BP 117/79   Pulse 87   Temp 99.4 F (37.4 C)   Resp 18   LMP 01/27/2024 (Approximate)   SpO2 99%   Visual Acuity Right Eye Distance:   Left Eye Distance:   Bilateral Distance:    Right Eye Near:   Left Eye Near:    Bilateral Near:     Physical Exam Vitals and nursing note reviewed.  Constitutional:      Appearance: Normal appearance.  HENT:     Head: Normocephalic and atraumatic.     Right Ear: External ear normal.     Left Ear: External ear normal.     Nose: Nose normal.     Mouth/Throat:     Mouth: Mucous membranes are moist.  Eyes:     Conjunctiva/sclera: Conjunctivae normal.  Cardiovascular:     Rate and Rhythm: Normal rate and regular rhythm.     Heart sounds: Normal heart sounds. No murmur heard. Pulmonary:     Effort: Pulmonary effort is normal. No respiratory distress.     Breath sounds: Normal breath sounds.  Skin:    General: Skin is warm and dry.  Neurological:     General: No focal deficit present.     Mental Status: She is alert and oriented to person, place, and time.  Psychiatric:        Mood and Affect: Mood normal.        Behavior: Behavior normal.      UC Treatments / Results  Labs (all labs ordered are listed, but only abnormal results are displayed) Labs Reviewed - No data to display  EKG   Radiology No results found.  Procedures Procedures (including critical care time)  Medications Ordered in UC Medications - No data to display  Initial Impression / Assessment and Plan / UC Course  I have reviewed the triage vital signs and the nursing notes.  Pertinent labs & imaging results that were available during my care of the patient were reviewed by me and considered in my medical decision making (see chart for details).  Vitals and triage reviewed,  patient is hemodynamically stable.  Lungs vesicular, heart with regular rate and rhythm.  Posterior pharynx with  some erythema.  Oxygenation 99% room air, no acute distress.  Chest x-ray deferred, imaging from last visit was clear.  Due to continued duration of symptoms and new onset of fever, will cover with Augmentin.  This would cover for sinusitis and pneumonia.  Wheezing and shortness of breath, another round of steroids given.  Cough management discussed.  Plan of care, follow-up care return precautions given, no questions at this time.     Final Clinical Impressions(s) / UC Diagnoses   Final diagnoses:  Sinobronchitis     Discharge Instructions      Take the antibiotics twice daily with food.  Take the prednisone daily with breakfast.  You can use the Tessalon Perles throughout the day and then the cough syrup at night because it may cause drowsiness.  You can alternate between 800 mg of ibuprofen  and 500 mg of Tylenol  every 4-6 hours for fever, body aches and chills.  Symptoms should improve with these medications, if no improvement or any changes return to clinic for reevaluation.    ED Prescriptions     Medication Sig Dispense Auth. Provider   amoxicillin -clavulanate (AUGMENTIN) 875-125 MG tablet Take 1 tablet by mouth every 12 (twelve) hours. 14 tablet Dreama, Deakin Lacek  N, FNP   promethazine -dextromethorphan (PROMETHAZINE -DM) 6.25-15 MG/5ML syrup Take 5 mLs by mouth 4 (four) times daily as needed for cough. 118 mL Dreama, Yariana Hoaglund  N, FNP   predniSONE (DELTASONE) 20 MG tablet Take 2 tablets (40 mg total) by mouth daily for 5 days. 10 tablet Dreama, Jess Sulak  N, FNP   benzonatate (TESSALON) 100 MG capsule Take 1 capsule (100 mg total) by mouth every 8 (eight) hours. 21 capsule Dreama, Jefte Carithers  N, FNP      PDMP not reviewed this encounter.   Dreama, Loranda Mastel  N, FNP 02/17/24 1701

## 2024-02-17 NOTE — Discharge Instructions (Addendum)
 Take the antibiotics twice daily with food.  Take the prednisone daily with breakfast.  You can use the Tessalon Perles throughout the day and then the cough syrup at night because it may cause drowsiness.  You can alternate between 800 mg of ibuprofen  and 500 mg of Tylenol  every 4-6 hours for fever, body aches and chills.  Symptoms should improve with these medications, if no improvement or any changes return to clinic for reevaluation.
